# Patient Record
Sex: Male | Born: 1935 | Race: White | Hispanic: No | Marital: Married | Smoking: Never smoker
Health system: Southern US, Community
[De-identification: ages and names within clinical notes are randomized; demographics above are authoritative.]

## PROBLEM LIST (undated history)

## (undated) DIAGNOSIS — R159 Full incontinence of feces: Secondary | ICD-10-CM

## (undated) DIAGNOSIS — R413 Other amnesia: Secondary | ICD-10-CM

## (undated) DIAGNOSIS — G20 Parkinson's disease: Secondary | ICD-10-CM

## (undated) DIAGNOSIS — I82409 Acute embolism and thrombosis of unspecified deep veins of unspecified lower extremity: Secondary | ICD-10-CM

## (undated) DIAGNOSIS — H532 Diplopia: Secondary | ICD-10-CM

## (undated) DIAGNOSIS — G4752 REM sleep behavior disorder: Secondary | ICD-10-CM

## (undated) DIAGNOSIS — R32 Unspecified urinary incontinence: Secondary | ICD-10-CM

## (undated) DIAGNOSIS — E785 Hyperlipidemia, unspecified: Secondary | ICD-10-CM

## (undated) DIAGNOSIS — G20A1 Parkinson's disease without dyskinesia, without mention of fluctuations: Secondary | ICD-10-CM

## (undated) DIAGNOSIS — R269 Unspecified abnormalities of gait and mobility: Secondary | ICD-10-CM

## (undated) HISTORY — DX: Acute embolism and thrombosis of unspecified deep veins of unspecified lower extremity: I82.409

## (undated) HISTORY — DX: Full incontinence of feces: R15.9

## (undated) HISTORY — DX: Parkinson's disease without dyskinesia, without mention of fluctuations: G20.A1

## (undated) HISTORY — DX: Hyperlipidemia, unspecified: E78.5

## (undated) HISTORY — DX: Unspecified abnormalities of gait and mobility: R26.9

## (undated) HISTORY — DX: Diplopia: H53.2

## (undated) HISTORY — PX: CHOLECYSTECTOMY: SHX55

## (undated) HISTORY — DX: Other amnesia: R41.3

## (undated) HISTORY — DX: REM sleep behavior disorder: G47.52

## (undated) HISTORY — DX: Parkinson's disease: G20

## (undated) HISTORY — DX: Unspecified urinary incontinence: R32

---

## 1999-09-01 ENCOUNTER — Encounter: Payer: Self-pay | Admitting: General Surgery

## 1999-09-03 ENCOUNTER — Observation Stay (HOSPITAL_COMMUNITY): Admission: RE | Admit: 1999-09-03 | Discharge: 1999-09-04 | Payer: Self-pay | Admitting: General Surgery

## 2000-09-15 ENCOUNTER — Encounter: Admission: RE | Admit: 2000-09-15 | Discharge: 2000-09-15 | Payer: Self-pay | Admitting: Internal Medicine

## 2000-09-15 ENCOUNTER — Encounter: Payer: Self-pay | Admitting: Internal Medicine

## 2000-09-16 ENCOUNTER — Encounter: Admission: RE | Admit: 2000-09-16 | Discharge: 2000-09-16 | Payer: Self-pay | Admitting: Internal Medicine

## 2000-09-16 ENCOUNTER — Encounter: Payer: Self-pay | Admitting: Internal Medicine

## 2000-09-30 ENCOUNTER — Encounter: Payer: Self-pay | Admitting: Internal Medicine

## 2000-09-30 ENCOUNTER — Encounter: Admission: RE | Admit: 2000-09-30 | Discharge: 2000-09-30 | Payer: Self-pay | Admitting: Internal Medicine

## 2010-05-06 ENCOUNTER — Encounter: Admission: RE | Admit: 2010-05-06 | Discharge: 2010-08-04 | Payer: Self-pay | Admitting: Neurology

## 2011-06-24 ENCOUNTER — Inpatient Hospital Stay (HOSPITAL_COMMUNITY)
Admission: EM | Admit: 2011-06-24 | Discharge: 2011-06-30 | DRG: 682 | Disposition: A | Discharge status: Rehabilitation Facility | Payer: Medicare Other | Attending: Internal Medicine | Admitting: Internal Medicine

## 2011-06-24 ENCOUNTER — Emergency Department (HOSPITAL_COMMUNITY): Payer: Medicare Other

## 2011-06-24 DIAGNOSIS — K117 Disturbances of salivary secretion: Secondary | ICD-10-CM | POA: Diagnosis present

## 2011-06-24 DIAGNOSIS — Z66 Do not resuscitate: Secondary | ICD-10-CM | POA: Diagnosis present

## 2011-06-24 DIAGNOSIS — E870 Hyperosmolality and hypernatremia: Secondary | ICD-10-CM | POA: Diagnosis present

## 2011-06-24 DIAGNOSIS — J3489 Other specified disorders of nose and nasal sinuses: Secondary | ICD-10-CM | POA: Diagnosis present

## 2011-06-24 DIAGNOSIS — I10 Essential (primary) hypertension: Secondary | ICD-10-CM | POA: Diagnosis present

## 2011-06-24 DIAGNOSIS — K5900 Constipation, unspecified: Secondary | ICD-10-CM | POA: Diagnosis present

## 2011-06-24 DIAGNOSIS — J189 Pneumonia, unspecified organism: Secondary | ICD-10-CM | POA: Diagnosis not present

## 2011-06-24 DIAGNOSIS — F0280 Dementia in other diseases classified elsewhere without behavioral disturbance: Secondary | ICD-10-CM | POA: Diagnosis present

## 2011-06-24 DIAGNOSIS — N179 Acute kidney failure, unspecified: Principal | ICD-10-CM | POA: Diagnosis present

## 2011-06-24 DIAGNOSIS — E785 Hyperlipidemia, unspecified: Secondary | ICD-10-CM | POA: Diagnosis present

## 2011-06-24 DIAGNOSIS — Z79899 Other long term (current) drug therapy: Secondary | ICD-10-CM

## 2011-06-24 DIAGNOSIS — M6282 Rhabdomyolysis: Secondary | ICD-10-CM | POA: Diagnosis present

## 2011-06-24 LAB — CBC
HCT: 38.4 % — ABNORMAL LOW (ref 39.0–52.0)
HCT: 34.6 % — ABNORMAL LOW (ref 39.0–52.0)
Hemoglobin: 13.6 g/dL (ref 13.0–17.0)
Hemoglobin: 12.2 g/dL — ABNORMAL LOW (ref 13.0–17.0)
MCH: 30.4 pg (ref 26.0–34.0)
MCHC: 35.3 g/dL (ref 30.0–36.0)
RBC: 4.42 MIL/uL (ref 4.22–5.81)
RBC: 4.01 MIL/uL — ABNORMAL LOW (ref 4.22–5.81)
WBC: 11.6 10*3/uL — ABNORMAL HIGH (ref 4.0–10.5)

## 2011-06-24 LAB — DIFFERENTIAL
Basophils Absolute: 0 10*3/uL (ref 0.0–0.1)
Lymphocytes Relative: 6 % — ABNORMAL LOW (ref 12–46)
Monocytes Absolute: 0.8 10*3/uL (ref 0.1–1.0)
Monocytes Relative: 8 % (ref 3–12)
Neutro Abs: 8.7 10*3/uL — ABNORMAL HIGH (ref 1.7–7.7)
Neutrophils Relative %: 86 % — ABNORMAL HIGH (ref 43–77)

## 2011-06-24 LAB — URINALYSIS, ROUTINE W REFLEX MICROSCOPIC
Glucose, UA: NEGATIVE mg/dL
Hgb urine dipstick: NEGATIVE
Leukocytes, UA: NEGATIVE
Specific Gravity, Urine: 1.018 (ref 1.005–1.030)
pH: 5 (ref 5.0–8.0)

## 2011-06-24 LAB — BASIC METABOLIC PANEL
BUN: 100 mg/dL — ABNORMAL HIGH (ref 6–23)
CO2: 15 mEq/L — ABNORMAL LOW (ref 19–32)
Chloride: 109 mEq/L (ref 96–112)
GFR calc Af Amer: 5 mL/min — ABNORMAL LOW (ref >60)
GFR calc non Af Amer: 4 mL/min — ABNORMAL LOW (ref >60)
Glucose, Bld: 80 mg/dL (ref 70–99)
Potassium: 4.5 mEq/L (ref 3.5–5.1)
Potassium: 4.1 mEq/L (ref 3.5–5.1)
Sodium: 145 mEq/L (ref 135–145)
Sodium: 146 mEq/L — ABNORMAL HIGH (ref 135–145)

## 2011-06-24 LAB — CK TOTAL AND CKMB (NOT AT ARMC)
CK, MB: 11.6 ng/mL (ref 0.3–4.0)
Total CK: 851 U/L — ABNORMAL HIGH (ref 7–232)

## 2011-06-25 ENCOUNTER — Inpatient Hospital Stay (HOSPITAL_COMMUNITY): Payer: Medicare Other

## 2011-06-25 ENCOUNTER — Other Ambulatory Visit (HOSPITAL_COMMUNITY): Payer: PRIVATE HEALTH INSURANCE

## 2011-06-25 LAB — SODIUM, URINE, RANDOM: Sodium, Ur: 62 mEq/L

## 2011-06-25 LAB — SEDIMENTATION RATE: Sed Rate: 44 mm/hr — ABNORMAL HIGH (ref 0–16)

## 2011-06-25 LAB — CBC
MCH: 31.5 pg (ref 26.0–34.0)
MCHC: 35.8 g/dL (ref 30.0–36.0)
Platelets: 198 10*3/uL (ref 150–400)
RDW: 14.7 % (ref 11.5–15.5)

## 2011-06-25 LAB — CARDIAC PANEL(CRET KIN+CKTOT+MB+TROPI)
CK, MB: 13 ng/mL (ref 0.3–4.0)
CK, MB: 9.8 ng/mL (ref 0.3–4.0)
Relative Index: 1.5 (ref 0.0–2.5)
Total CK: 748 U/L — ABNORMAL HIGH (ref 7–232)
Troponin I: <0.3 ng/mL (ref <0.30)
Troponin I: <0.3 ng/mL (ref <0.30)
Troponin I: <0.3 ng/mL (ref <0.30)

## 2011-06-25 LAB — DIFFERENTIAL
Basophils Absolute: 0 10*3/uL (ref 0.0–0.1)
Basophils Relative: 0 % (ref 0–1)
Eosinophils Absolute: 0.1 10*3/uL (ref 0.0–0.7)
Eosinophils Relative: 1 % (ref 0–5)
Monocytes Absolute: 0.9 10*3/uL (ref 0.1–1.0)
Monocytes Relative: 9 % (ref 3–12)
Neutro Abs: 8.5 10*3/uL — ABNORMAL HIGH (ref 1.7–7.7)

## 2011-06-25 LAB — LIPID PANEL
HDL: 44 mg/dL (ref >39)
LDL Cholesterol: 116 mg/dL — ABNORMAL HIGH (ref 0–99)
Total CHOL/HDL Ratio: 4.1 RATIO
Triglycerides: 107 mg/dL (ref <150)

## 2011-06-25 LAB — PROTEIN / CREATININE RATIO, URINE
Creatinine, Urine: 127.97 mg/dL
Total Protein, Urine: 12.8 mg/dL

## 2011-06-25 LAB — VITAMIN D 25 HYDROXY (VIT D DEFICIENCY, FRACTURES): Vit D, 25-Hydroxy: 52 ng/mL (ref 30–89)

## 2011-06-25 LAB — BASIC METABOLIC PANEL
Calcium: 8.4 mg/dL (ref 8.4–10.5)
GFR calc Af Amer: 9 mL/min — ABNORMAL LOW (ref >60)
GFR calc non Af Amer: 7 mL/min — ABNORMAL LOW (ref >60)
Glucose, Bld: 87 mg/dL (ref 70–99)
Potassium: 5 mEq/L (ref 3.5–5.1)
Sodium: 150 mEq/L — ABNORMAL HIGH (ref 135–145)

## 2011-06-25 LAB — GLUCOSE, CAPILLARY: Glucose-Capillary: 70 mg/dL (ref 70–99)

## 2011-06-25 LAB — PHOSPHORUS: Phosphorus: 4.3 mg/dL (ref 2.3–4.6)

## 2011-06-26 ENCOUNTER — Inpatient Hospital Stay (HOSPITAL_COMMUNITY): Payer: Medicare Other

## 2011-06-26 LAB — COMPREHENSIVE METABOLIC PANEL
ALT: 57 U/L — ABNORMAL HIGH (ref 0–53)
AST: 38 U/L — ABNORMAL HIGH (ref 0–37)
Alkaline Phosphatase: 115 U/L (ref 39–117)
CO2: 29 mEq/L (ref 19–32)
Calcium: 8.3 mg/dL — ABNORMAL LOW (ref 8.4–10.5)
Chloride: 115 mEq/L — ABNORMAL HIGH (ref 96–112)
GFR calc Af Amer: >60 mL/min (ref >60)
GFR calc non Af Amer: 50 mL/min — ABNORMAL LOW (ref >60)
Glucose, Bld: 121 mg/dL — ABNORMAL HIGH (ref 70–99)
Potassium: 3.4 mEq/L — ABNORMAL LOW (ref 3.5–5.1)
Sodium: 156 mEq/L — ABNORMAL HIGH (ref 135–145)

## 2011-06-26 LAB — GLUCOSE, CAPILLARY
Glucose-Capillary: 88 mg/dL (ref 70–99)
Glucose-Capillary: 89 mg/dL (ref 70–99)
Glucose-Capillary: 99 mg/dL (ref 70–99)

## 2011-06-26 LAB — CBC
HCT: 37 % — ABNORMAL LOW (ref 39.0–52.0)
Hemoglobin: 12.6 g/dL — ABNORMAL LOW (ref 13.0–17.0)
MCH: 30.4 pg (ref 26.0–34.0)
MCHC: 34.1 g/dL (ref 30.0–36.0)
MCV: 89.4 fL (ref 78.0–100.0)
RBC: 4.14 MIL/uL — ABNORMAL LOW (ref 4.22–5.81)

## 2011-06-27 LAB — HEPATIC FUNCTION PANEL
Alkaline Phosphatase: 109 U/L (ref 39–117)
Bilirubin, Direct: 0.1 mg/dL (ref 0.0–0.3)
Indirect Bilirubin: 0.5 mg/dL (ref 0.3–0.9)
Total Bilirubin: 0.6 mg/dL (ref 0.3–1.2)

## 2011-06-27 LAB — BASIC METABOLIC PANEL
Calcium: 8.2 mg/dL — ABNORMAL LOW (ref 8.4–10.5)
GFR calc Af Amer: >60 mL/min (ref >60)
GFR calc non Af Amer: >60 mL/min (ref >60)
Potassium: 3.6 mEq/L (ref 3.5–5.1)
Sodium: 157 mEq/L — ABNORMAL HIGH (ref 135–145)

## 2011-06-27 LAB — CK TOTAL AND CKMB (NOT AT ARMC)
CK, MB: 5.8 ng/mL — ABNORMAL HIGH (ref 0.3–4.0)
Total CK: 672 U/L — ABNORMAL HIGH (ref 7–232)

## 2011-06-27 LAB — CBC
Hemoglobin: 12.8 g/dL — ABNORMAL LOW (ref 13.0–17.0)
MCHC: 33 g/dL (ref 30.0–36.0)
RDW: 15 % (ref 11.5–15.5)
WBC: 9.6 10*3/uL (ref 4.0–10.5)

## 2011-06-27 LAB — VITAMIN D 1,25 DIHYDROXY
Vitamin D 1, 25 (OH)2 Total: 30 pg/mL (ref 18–72)
Vitamin D3 1, 25 (OH)2: <8 pg/mL

## 2011-06-27 LAB — GLUCOSE, CAPILLARY: Glucose-Capillary: 135 mg/dL — ABNORMAL HIGH (ref 70–99)

## 2011-06-28 ENCOUNTER — Inpatient Hospital Stay (HOSPITAL_COMMUNITY): Payer: Medicare Other

## 2011-06-28 LAB — CBC
HCT: 41.1 % (ref 39.0–52.0)
MCH: 30.6 pg (ref 26.0–34.0)
MCHC: 33.6 g/dL (ref 30.0–36.0)
MCV: 91.1 fL (ref 78.0–100.0)
RDW: 14.9 % (ref 11.5–15.5)
WBC: 11.3 10*3/uL — ABNORMAL HIGH (ref 4.0–10.5)

## 2011-06-28 LAB — BASIC METABOLIC PANEL
BUN: 19 mg/dL (ref 6–23)
Creatinine, Ser: 1.13 mg/dL (ref 0.50–1.35)
GFR calc non Af Amer: >60 mL/min (ref >60)
Glucose, Bld: 136 mg/dL — ABNORMAL HIGH (ref 70–99)
Potassium: 3.5 mEq/L (ref 3.5–5.1)

## 2011-06-29 DIAGNOSIS — R5381 Other malaise: Secondary | ICD-10-CM

## 2011-06-29 DIAGNOSIS — G20 Parkinson's disease: Secondary | ICD-10-CM

## 2011-06-29 LAB — BASIC METABOLIC PANEL
BUN: 17 mg/dL (ref 6–23)
Creatinine, Ser: 1.1 mg/dL (ref 0.50–1.35)
GFR calc Af Amer: >60 mL/min (ref >60)
GFR calc non Af Amer: >60 mL/min (ref >60)

## 2011-06-29 LAB — CBC
Hemoglobin: 13.4 g/dL (ref 13.0–17.0)
MCH: 30.2 pg (ref 26.0–34.0)
MCHC: 33.2 g/dL (ref 30.0–36.0)

## 2011-06-30 ENCOUNTER — Inpatient Hospital Stay (HOSPITAL_COMMUNITY): Payer: Medicare Other

## 2011-06-30 ENCOUNTER — Inpatient Hospital Stay (HOSPITAL_COMMUNITY)
Admission: RE | Admit: 2011-06-30 | Discharge: 2011-07-14 | DRG: 945 | Disposition: A | Discharge status: Skilled Nursing Facility | Payer: Medicare Other | Source: Other Acute Inpatient Hospital | Attending: Physical Medicine & Rehabilitation | Admitting: Physical Medicine & Rehabilitation

## 2011-06-30 DIAGNOSIS — D72829 Elevated white blood cell count, unspecified: Secondary | ICD-10-CM

## 2011-06-30 DIAGNOSIS — R5381 Other malaise: Secondary | ICD-10-CM

## 2011-06-30 DIAGNOSIS — F3289 Other specified depressive episodes: Secondary | ICD-10-CM

## 2011-06-30 DIAGNOSIS — R1310 Dysphagia, unspecified: Secondary | ICD-10-CM

## 2011-06-30 DIAGNOSIS — R269 Unspecified abnormalities of gait and mobility: Secondary | ICD-10-CM

## 2011-06-30 DIAGNOSIS — G20A1 Parkinson's disease without dyskinesia, without mention of fluctuations: Secondary | ICD-10-CM

## 2011-06-30 DIAGNOSIS — Z5189 Encounter for other specified aftercare: Principal | ICD-10-CM

## 2011-06-30 DIAGNOSIS — M6282 Rhabdomyolysis: Secondary | ICD-10-CM

## 2011-06-30 DIAGNOSIS — N179 Acute kidney failure, unspecified: Secondary | ICD-10-CM

## 2011-06-30 DIAGNOSIS — K5909 Other constipation: Secondary | ICD-10-CM

## 2011-06-30 DIAGNOSIS — R339 Retention of urine, unspecified: Secondary | ICD-10-CM

## 2011-06-30 DIAGNOSIS — F329 Major depressive disorder, single episode, unspecified: Secondary | ICD-10-CM

## 2011-06-30 DIAGNOSIS — N138 Other obstructive and reflux uropathy: Secondary | ICD-10-CM

## 2011-06-30 DIAGNOSIS — J189 Pneumonia, unspecified organism: Secondary | ICD-10-CM

## 2011-06-30 DIAGNOSIS — F0390 Unspecified dementia without behavioral disturbance: Secondary | ICD-10-CM

## 2011-06-30 DIAGNOSIS — Z79899 Other long term (current) drug therapy: Secondary | ICD-10-CM

## 2011-06-30 DIAGNOSIS — E785 Hyperlipidemia, unspecified: Secondary | ICD-10-CM

## 2011-06-30 DIAGNOSIS — G20 Parkinson's disease: Secondary | ICD-10-CM

## 2011-06-30 DIAGNOSIS — N401 Enlarged prostate with lower urinary tract symptoms: Secondary | ICD-10-CM

## 2011-06-30 DIAGNOSIS — E870 Hyperosmolality and hypernatremia: Secondary | ICD-10-CM

## 2011-06-30 DIAGNOSIS — I951 Orthostatic hypotension: Secondary | ICD-10-CM

## 2011-06-30 NOTE — Op Note (Signed)
  NAMEROLLY, MAGRI NO.:  [id number]  MEDICAL RECORD NO.:  [id number]  LOCATION:  4710                         FACILITY:  [facility]  PHYSICIAN:  Antony Contras, MD     DATE OF BIRTH:  16-May-1935  DATE OF PROCEDURE:  06/26/2011 DATE OF DISCHARGE:                              OPERATIVE REPORT   PREOPERATIVE DIAGNOSIS:  Nasal mass.  POSTOPERATIVE DIAGNOSIS:  Caked and dried secretions in the pharynx and oral cavity.  PROCEDURE:  Flexible laryngoscopy.  SURGEON:  Antony Contras, MD  ANESTHESIA:  None.  COMPLICATIONS:  None.  INDICATIONS:  The patient is a 76 year old white male who is admitted to the hospital due to acute renal failure and while trying to place a nasogastric tube, nursing had obstruction and then dislodged a mass-like structure.  Evaluation was requested.  FINDINGS:  After passing a little bit of crusting at the anterior nose, the nasal passages are normal on both sides with normal turbinates andnormal septum and no mass or abnormality.  The nasopharynx is also normal.  The pharynx is lined with dry secretions much like the mouth with a prominent globe in the vallecula.  There is no other mass or abnormality seen.  DESCRIPTION OF PROCEDURE:  The patient was identified in the hospital room and informed consent was obtained from the family after discussion of risks, benefits, and alternatives.  A flexible laryngoscope was passed first through the right nasal passage and into the left nasal passage, to the nasal passages, pharynx and larynx.  Findings as noted above.  After this, the telescope was removed.  RECOMMENDATIONS:  The mass-like structure removed by nursing was simply crusted secretions.  He has a significant build-up in his mouth and pharynx largely due to dryness and poor swallow.  I assisted the nursing staff with successful placement of a nasogastric tube.  I discussed the case with the primary team who approved allowing him  to sip water only to try to moisturize his secretions so he can handle them better.  I will order a humidified face shield as well and ask that the family continue to swab his mouth regularly with moisturization.     Antony Contras, MD     DDB/MEDQ  D:  06/26/2011  T:  06/26/2011  Job:  [id number]  Electronically Signed by Christia Reading MD on 06/30/2011 08:13:21 PM

## 2011-06-30 NOTE — Consult Note (Signed)
Darin Hickman, DELPILAR NO.:  [id number]  MEDICAL RECORD NO.:  [id number]  LOCATION:  4710                         FACILITY:  [facility]  PHYSICIAN:  Antony Contras, MD     DATE OF BIRTH:  May 14, 1935  DATE OF CONSULTATION:  06/26/2011 DATE OF DISCHARGE:                                CONSULTATION   REQUESTING SERVICE:  [facility].  CHIEF COMPLAINT:  Nasal mass.  HISTORY OF PRESENT ILLNESS:  The patient is a 76 year old white male who was admitted to the hospital 2 days ago due to mental status changes and convulsions and was found to be in acute renal failure.  He has a background of Parkinson disease and his family noticed increased tremors, and jerking movements that made him think seizure activity. His wife had noted that he was not voiding very well either and he has been constipated for the past 4-5 days.  P.o. intake has been significantly impaired as well.  He was admitted to the hospital with acute renal failure and treated with hydration and his renal failure has responded quite rapidly.  However, his mental status remains decreased. Consultation was requested of ENT because the nursing staff tried to place a nasogastric tube today and met obstruction.  Upon removing the tube, a mass-like structure came out of the nose.  The nasogastric tube is being placed for nutritional purposes due to his poor swallow and poor mental status.  The patient is not able to give any history because of his mental status.  PAST MEDICAL HISTORY: 1. Parkinson disease. 2. Dementia. 3. Acute renal failure.  HOME MEDICATIONS:  Sinemet, Comtan, amantadine, mirtazapine, calcium and vitamin D.  ALLERGIES:  None.  SOCIAL HISTORY:  The family denies that he smokes or use alcohol.  He lives at home with his wife, but there are plans for transfer to a skilled nursing facility.  REVIEW OF SYSTEMS:  Unable to be obtained.  PHYSICAL EXAMINATION:  VITAL SIGNS:  T-max  100.3.  Vital signs stable. GENERAL:  The patient is somnolent and not interactive, but does respond to stimulus. HEENT:  Eyes, the eyes are bit sunken, but pupils are equal and reactive to light.  Ears, external ears normal.  External canals are patent. Nose, external nose is normal.  Nasal passages are somewhat obstructed anteriorly due to crusting. Oral cavity, oropharynx, the lips are normal.  The patient is edentulous.  Mucous membranes are dry and there is buildup of crusting along the superior alveolus medially as well as on the soft palate area. The remainder of the mouth is otherwise normal. NECK:  Nontender without mass or deformity. SALIVARY GLANDS:  Normal to palpation. THYROID:  Normal to palpation. LYMPHATICS:  There are no enlarged lymph nodes in the neck. NEUROLOGIC: Cranial nerves II-XII are difficult to examine due to mental status. HEARING:  Difficult to determine. VOICE:  The patient did not voice during the visit.  ASSESSMENT:  The patient is a 76 year old white male with an unknown mass-like structure that came out during placement of nasogastric tube.  PLAN:  The nasal passages and pharynx will be examined via a flexible laryngoscopy.  The thickened  crusted secretions in the mouth are suggestive of what came out with the nasogastric tube.  This will be confirmed at laryngoscopy.     Antony Contras, MD     DDB/MEDQ  D:  06/26/2011  T:  06/26/2011  Job:  [id number]  Electronically Signed by Christia Reading MD on 06/30/2011 08:13:28 PM

## 2011-07-01 ENCOUNTER — Inpatient Hospital Stay (HOSPITAL_COMMUNITY): Payer: Medicare Other

## 2011-07-01 DIAGNOSIS — R5381 Other malaise: Secondary | ICD-10-CM

## 2011-07-01 DIAGNOSIS — R471 Dysarthria and anarthria: Secondary | ICD-10-CM

## 2011-07-01 DIAGNOSIS — G20 Parkinson's disease: Secondary | ICD-10-CM

## 2011-07-01 LAB — CBC
HCT: 40 % (ref 39.0–52.0)
Hemoglobin: 13.4 g/dL (ref 13.0–17.0)
MCH: 30.4 pg (ref 26.0–34.0)
MCHC: 33.5 g/dL (ref 30.0–36.0)

## 2011-07-01 LAB — DIFFERENTIAL
Basophils Absolute: 0.1 10*3/uL (ref 0.0–0.1)
Lymphocytes Relative: 13 % (ref 12–46)
Monocytes Absolute: 0.7 10*3/uL (ref 0.1–1.0)
Monocytes Relative: 7 % (ref 3–12)
Neutro Abs: 8.3 10*3/uL — ABNORMAL HIGH (ref 1.7–7.7)

## 2011-07-01 LAB — URINALYSIS, ROUTINE W REFLEX MICROSCOPIC
Bilirubin Urine: NEGATIVE
Glucose, UA: NEGATIVE mg/dL
Hgb urine dipstick: NEGATIVE
Specific Gravity, Urine: 1.021 (ref 1.005–1.030)
pH: 7.5 (ref 5.0–8.0)

## 2011-07-01 LAB — COMPREHENSIVE METABOLIC PANEL
Alkaline Phosphatase: 108 U/L (ref 39–117)
BUN: 21 mg/dL (ref 6–23)
GFR calc Af Amer: >60 mL/min (ref >60)
GFR calc non Af Amer: 54 mL/min — ABNORMAL LOW (ref >60)
Glucose, Bld: 94 mg/dL (ref 70–99)
Potassium: 4 mEq/L (ref 3.5–5.1)
Total Bilirubin: 0.6 mg/dL (ref 0.3–1.2)
Total Protein: 5.9 g/dL — ABNORMAL LOW (ref 6.0–8.3)

## 2011-07-01 LAB — CULTURE, BLOOD (ROUTINE X 2)
Culture  Setup Time: 201207200941
Culture: NO GROWTH

## 2011-07-02 LAB — CK: Total CK: 58 U/L (ref 7–232)

## 2011-07-02 LAB — URINE CULTURE: Culture  Setup Time: 201207260855

## 2011-07-03 LAB — CBC
HCT: 39.3 % (ref 39.0–52.0)
Hemoglobin: 13.1 g/dL (ref 13.0–17.0)
MCV: 91.2 fL (ref 78.0–100.0)
RBC: 4.31 MIL/uL (ref 4.22–5.81)
WBC: 13 10*3/uL — ABNORMAL HIGH (ref 4.0–10.5)

## 2011-07-03 LAB — BASIC METABOLIC PANEL
BUN: 23 mg/dL (ref 6–23)
CO2: 28 mEq/L (ref 19–32)
Chloride: 104 mEq/L (ref 96–112)
Creatinine, Ser: 1.09 mg/dL (ref 0.50–1.35)
Glucose, Bld: 105 mg/dL — ABNORMAL HIGH (ref 70–99)

## 2011-07-05 DIAGNOSIS — R471 Dysarthria and anarthria: Secondary | ICD-10-CM

## 2011-07-05 DIAGNOSIS — G20 Parkinson's disease: Secondary | ICD-10-CM

## 2011-07-05 LAB — URINALYSIS, ROUTINE W REFLEX MICROSCOPIC
Bilirubin Urine: NEGATIVE
Glucose, UA: NEGATIVE mg/dL
Ketones, ur: NEGATIVE mg/dL
Specific Gravity, Urine: 1.02 (ref 1.005–1.030)
pH: 6.5 (ref 5.0–8.0)

## 2011-07-05 LAB — URINE MICROSCOPIC-ADD ON

## 2011-07-06 DIAGNOSIS — G20 Parkinson's disease: Secondary | ICD-10-CM

## 2011-07-06 DIAGNOSIS — R471 Dysarthria and anarthria: Secondary | ICD-10-CM

## 2011-07-06 LAB — URINE CULTURE
Colony Count: NO GROWTH
Culture  Setup Time: 201207301926

## 2011-07-07 ENCOUNTER — Ambulatory Visit (HOSPITAL_COMMUNITY): Payer: PRIVATE HEALTH INSURANCE

## 2011-07-07 NOTE — Discharge Summary (Signed)
Darin Hickman, Darin Hickman NO.:  [id number]  MEDICAL RECORD NO.:  [id number]  LOCATION:  4710                         FACILITY:  [facility]  PHYSICIAN:  Calvert Cantor, M.D.     DATE OF BIRTH:  Jul 16, 1935  DATE OF ADMISSION:  06/24/2011 DATE OF DISCHARGE:  06/30/2011                              DISCHARGE SUMMARY   PRIMARY CARE PHYSICIAN:  Dr. Clarene Duke in [place].  The patient does not know the first name.  PRESENTING COMPLAINT:  Brought in by wife for jerky movement, lethargy, poor p.o. intake.  DISCHARGE DIAGNOSES: 1. Acute renal failure likely from dehydration. 2. Acidosis secondary to above. 3. Hypernatremia secondary to dehydration. 4. Pneumonia, possibly aspiration. 5. Rhabdomyolysis. 6. Parkinson disease. 7. Mild dementia. 8. Do not resuscitate.  DISCHARGE MEDICATIONS: 1. Augmentin 875 mg b.i.d. for three more days, he will start his     first dose tonight. 2. Ensure clinical strength 237 mL twice a day. 3. Amantadine 100 mg twice a day. 4. Comtan 200 mg four times a day. 5. Mirtazapine 15 mg daily at bedtime. 6. Sinemet 25/250 one tablet four times a day.  CONSULTS: 1. Nephrology consult with Dr. Zetta Bills. 2. ENT consult with Dr. Christia Reading.  PROCEDURES: 1. Chest x-ray, portable, June 24, 2011, did not reveal any acute     cardiopulmonary disease.  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 2. CT of the head without contrast, June 24, 2011, revealed a small     vessel disease type changes.  No intracranial hemorrhage or CT     evidence of acute infarct.  There was mild global atrophy and     vascular calcifications. 3. Ultrasound of the kidneys, June 25, 2011 revealed normal bilateral     renal ultrasound. 4. Chest x-ray, portable, June 26, 2011 revealed new patchy     bronchopneumonia involving the medial right lung base.  Nasogastric     tube was noted in the fundus of the stomach. 5. Swallow eval, June 28, 2011, did not reveal any aspiration, but     found him to be at a high risk for aspiration.  He was placed on     pureed diet with thin liquids. 6. Flexible laryngoscopy performed June 26, 2011 by Dr. Christia Reading     revealed crusting in the anterior part of nose, normal nasal     passages, turbinate, septum, and nasopharynx.  Pharynx was lined     with dried secretions much like the mouth with a prominent globe in     the vallecula.  No other mass or abnormality noted.  HOSPITAL COURSE:  This is a 76 year old male with Parkinson disease, who was brought in by his wife for poor p.o. intake, lethargy, increasing jerky movements, and decreased urine output.  She also noticed he had been constipated.  The patient was found to be in acute renal failure.  BUN was 104, creatinine 12.8.  Foley catheter was placed and 2 liters of urine was obtained and there was a question of whether or not he may have had urinary retention.  His sodium was noted  to be 146.  Bicarb was 15.  The patient was admitted to the Hospitalist Service and started on IV fluids.  He was seen by Dr. Zetta Bills in consult.  Interestingly following day, creatinine improved significantly to 7.24 and then 1.39 on the next day.  It has been within normal limits since then.  The patient was quite lethargic.  NG tube was placed through which he received feeding.  Nurse noted a large amount of crusting in his mouth and the mass was retrieved from his oropharynx.  An ENT consult was requested and a flexible laryngeal revealed further crusting essentially suspected to be due to poor swallowing ability.  Eventually, the patient became more alert, but speech and swallow eval was performed and no visible aspiration was noted.  The patient was placed on dysphagia diet with thin liquids, but also on strict aspiration precautions.  He has been swallowing well and he is now eating quite well.  On July 21, he was noted to have  low-grade fevers.  A chest x-ray was obtained, which revealed above-mentioned infiltrate.  It was suspected he may have aspirated and he was started on Zosyn, today he is day 5 of Zosyn.  He is not having any cough.  His oxygen levels on room air are satisfactory and his lungs are clear.  He will take three more days of Augmentin to complete a 7-day course.  The patient did have mild rhabdomyolysis.  CK on admission was 851, increasing to 921 the following day, and has since then been improving.  Sodium on arrival was 146 and worsened to 157 on July 22; with D5 water, it has improved to 145 when last checked yesterday.  PERTINENT LAB WORK:  Blood cultures have remained negative x2 sets. Lipid profile revealed a slightly elevated LDL at 116.  TSH was within normal limits at 2.313.  Vitamin D levels were within normal limits. ESR was mildly elevated at 44 when checked on July 20.  PHYSICAL EXAM:  GENERAL:  Today, the patient is awake, alert, oriented x3. HEENT:  Oral mucosa is moist. NECK:  Supple.  No thyromegaly or lymphadenopathy. LUNGS:  Clear bilaterally with good respiratory effort. HEART:  Regular rate and rhythm.  No murmurs. ABDOMEN:  Soft, nontender, nondistended.  Bowel sounds positive. EXTREMITIES:  No cyanosis, clubbing, or edema.  The patient has been evaluated by rehab and was recommended to go to rehab facility.  We did have CIR evaluate him as recommended by PT and they have expected him for rehab.  He is being transferred there today. The patient will also receive OT as well.  FOLLOWUP INSTRUCTIONS:  He can follow up with his primary care physician in 1-2 weeks.  Time on patient care today was 50 minutes.     Calvert Cantor, M.D.     SR/MEDQ  D:  06/30/2011  T:  06/30/2011  Job:  [id number]  Electronically Signed by Calvert Cantor M.D. on 07/07/2011 07:26:49 AM

## 2011-07-08 DIAGNOSIS — G20 Parkinson's disease: Secondary | ICD-10-CM

## 2011-07-08 DIAGNOSIS — R471 Dysarthria and anarthria: Secondary | ICD-10-CM

## 2011-07-08 LAB — CBC
HCT: 40.8 % (ref 39.0–52.0)
MCH: 31.1 pg (ref 26.0–34.0)
MCV: 90.1 fL (ref 78.0–100.0)
Platelets: 334 10*3/uL (ref 150–400)
RBC: 4.53 MIL/uL (ref 4.22–5.81)
RDW: 14.4 % (ref 11.5–15.5)

## 2011-07-09 DIAGNOSIS — G20 Parkinson's disease: Secondary | ICD-10-CM

## 2011-07-09 DIAGNOSIS — R471 Dysarthria and anarthria: Secondary | ICD-10-CM

## 2011-07-09 DIAGNOSIS — R1310 Dysphagia, unspecified: Secondary | ICD-10-CM

## 2011-07-12 ENCOUNTER — Inpatient Hospital Stay (HOSPITAL_COMMUNITY): Payer: Medicare Other

## 2011-07-12 DIAGNOSIS — G20 Parkinson's disease: Secondary | ICD-10-CM

## 2011-07-12 DIAGNOSIS — R471 Dysarthria and anarthria: Secondary | ICD-10-CM

## 2011-07-12 LAB — CBC
HCT: 41 % (ref 39.0–52.0)
Hemoglobin: 13.8 g/dL (ref 13.0–17.0)
MCH: 30.4 pg (ref 26.0–34.0)
MCHC: 33.7 g/dL (ref 30.0–36.0)
RDW: 14.2 % (ref 11.5–15.5)

## 2011-07-12 LAB — BASIC METABOLIC PANEL
BUN: 17 mg/dL (ref 6–23)
Calcium: 9.1 mg/dL (ref 8.4–10.5)
GFR calc Af Amer: >60 mL/min (ref >60)
GFR calc non Af Amer: >60 mL/min (ref >60)
Glucose, Bld: 96 mg/dL (ref 70–99)
Potassium: 3.8 mEq/L (ref 3.5–5.1)

## 2011-07-13 LAB — URINALYSIS, ROUTINE W REFLEX MICROSCOPIC
Bilirubin Urine: NEGATIVE
Ketones, ur: NEGATIVE mg/dL
Nitrite: POSITIVE — AB
Protein, ur: NEGATIVE mg/dL
Specific Gravity, Urine: 1.008 (ref 1.005–1.030)
Urobilinogen, UA: 1 mg/dL (ref 0.0–1.0)

## 2011-07-13 LAB — URINE MICROSCOPIC-ADD ON

## 2011-07-14 DIAGNOSIS — R471 Dysarthria and anarthria: Secondary | ICD-10-CM

## 2011-07-14 DIAGNOSIS — R1310 Dysphagia, unspecified: Secondary | ICD-10-CM

## 2011-07-14 DIAGNOSIS — G20 Parkinson's disease: Secondary | ICD-10-CM

## 2011-07-15 LAB — URINE CULTURE
Colony Count: >=100000
Culture  Setup Time: 201208071406

## 2011-07-20 NOTE — H&P (Signed)
Darin Hickman, Darin Hickman NO.:  [id number]  MEDICAL RECORD NO.:  [id number]  LOCATION:  4148                         FACILITY:  [facility]  PHYSICIAN:  Ranelle Oyster, M.D.DATE OF BIRTH:  22-Sep-1935  DATE OF ADMISSION:  06/30/2011 DATE OF DISCHARGE:                             HISTORY & PHYSICAL   CHIEF COMPLAINT:  Weakness and loss of balance as well as confusion.  PRIMARY CARE PHYSICIAN:  Dr. Mallie Snooks.  HISTORY OF PRESENT ILLNESS:  This is a 76 year old white male with Parkinson's disease and chronic constipation, admitted on June 24, 2011 with voiding problems.  He developed seizure-type activity and tremors and sedation at the time of admission.  Workup revealed rhabdomyolysis with CK of 851 and hypernatremia.  He was seen by Renal who recommend IV fluids for hydration and Foley for strict I's and O's.  Renal ultrasound was normal.  The patient continue with lethargy and low-grade fever and chest x-ray on June 26, 2011 showed patchy bronchopneumonia in the medial right lung base and he was started on IV antibiotics for treatment.  Modified barium swallow was done on June 28, 2011 showing severe residue, no penetration or aspiration.  He was placed on D1 thin liquid diet.  Therapies were initiated and requires cues for sequencing, motor planning and needs max facilitation for upright posture and motor planning.  I was asked to see this patient yesterday for rehab consultation and felt he could benefit from an inpatient rehab stay.  REVIEW OF SYSTEMS:  Notable for nocturia, urinary hesitancy.  Occasional problems of swallowing solids at home.  He had occasional incontinence and weakness.  Full 12-point review is in the written H and P.  PAST MEDICAL HISTORY:  Positive for: 1. Parkinson's disease. 2. Dementia, which has been fairly mild for family. 3. Constipation. 4. Dyslipidemia. 5. Gait disorder. 6. Falls.  FAMILY HISTORY:  Positive for CVA and  hypertension.  SOCIAL HISTORY:  The patient is married, lives in one-level house with three to four steps to enter.  Does not smoke or drink.  Wife can assist at discharge.  ALLERGIES:  None.  HOME MEDICATIONS:  Remeron, Comtan, amantadine, and Sinemet.  LABORATORY DATA:  Hemoglobin 13.4, white count 10, platelets 219. Sodium 145, potassium 3.5, BUN 17, creatinine 1.10.  PHYSICAL EXAMINATION:  VITAL SIGNS:  Blood pressure is 102/70, pulse 71, respiratory rate 20, temperature 98.0. GENERAL:  The patient is generally alert and pleasant.  He has masked facies.  He is very thin. HEENT:  Pupils are equally round and reactive to light.  Ear, nose, and throat exams are notable for partial dentures and dry mucosa. NECK:  Supple without JVD or lymphadenopathy. CHEST:  Clear to auscultation bilaterally without wheezes, rales or rhonchi. HEART:  Notable for systolic murmur, otherwise regular. EXTREMITIES:  Showed no clubbing, cyanosis or edema. ABDOMEN:  Soft, nontender.  Bowel sounds are positive. SKIN:  Generally intact. NEUROLOGIC:  Cranial nerves II through XII showed no focal abnormalities.  Judgment was fair.  Orientation was intact.  Memory was fair for short-term information.  He was oriented, however, to day, date, and year with extra time.  Mood was pleasant  and slightly flat. Speech was quite nasal and dysarthric and monotone.  The patient did have some mild cogwheel rigidity and mild intentional tremor.  He had no pronator drift.  Strength is grossly 4/5 in the upper extremities proximal and distal.  Lower extremity strength is 3/5 proximally and 4/5 distally.  POST ADMISSION PHYSICIAN EVALUATION: 1. Functional deficit secondary to progressive Parkinson     disease along with rhabdomyolysis and general deconditioning. 2. The patient was admitted to receive collaborative interdisciplinary     care between the physiatrist, rehab nursing staff, and therapy     team. 3. The  patient's level of medical complexity and substantial therapy     needs in context of that medical necessity cannot be provided at a     lesser intensity of care. 4. The patient has experienced substantial functional loss from his     baseline.  Premorbidly, the patient was independent with cane until     1 week prior to arrival.  Judging by the patient's diagnosis,     physical exam, and functional history, he has potential for     functional progress, which will result in measurable gains while in     inpatient rehab.  The patient is currently min-assist bed mobility     with significant posterior lean and poor posture.  He is +2 total-     assist for stand pivot transfer, mod-assist upper body care, max-     assist lower body care.  These gains will be of substantial and     practical use upon discharge to home and facilitating mobility and     self-care. 5. The physiatrist will provide 24-hour management of the medical     needs as well as oversight of the therapy plan/treatment and     provide guidance as appropriate guarding interaction of the two.     Medical problem list and plan are below. 6. A 24-hour rehab nursing team will assist in the management of the     patient's skin care needs as well as bowel and bladder function,     nutrition, integration of therapy concepts, techniques, education,     etc. 7. PT will assess and treat for lower extremity strength, range of     motion, adaptive techniques, and equipment.  Techniques for     improving gait quality and stability, family education with goals     modified independent to supervision. 8. OT will assess and treat for upper extremity use, ADLs, adaptive     techniques, equipment, functional mobility, safety, cognitive and     perceptual training.  Goals modified independent to occasional min-     assist. 9. Speech Language Pathology will follow up for speech/communication     as well as swallowing with goals supervision to  min-assist. 10.Team conference will be held weekly to assess progress towards     goals and to determine barriers at discharge. 11.The patient has demonstrate sufficient medical stability and     exercise capacity to tolerate at least 3 hours of therapy per day     at least 5 days per week. 12.Estimated length of stay is approximately 2 weeks.  Prognosis is     good.  Family is involved.  The patient is motivated.  MEDICAL PROBLEM LIST AND PLAN: 1. Deep venous thrombosis prophylaxis, subcu Lovenox.  Platelets have     been steady.  We will follow up platelets on admission, look for     any  active signs or symptoms of bleeding. 2. Right lower lobe pneumonia:  This is day 5 of 7 of antibiotics at     this point.  Chest is clearing.  The patient is at risk for     aspiration giving his dysphagia, this will bear close observation.     Observe for signs and symptoms of pneumonia.  Speech Language     Pathology and staff will follow closely for swallowing competency. 3. Chronic constipation:  Check KUB today, rule out obstipation.     Start regular bowel program, which will need to be continued over     on an outpatient basis. 4. Acute renal failure, push p.o. fluids, remains hydration.  Continue     nutritional supplementation.  Urine is still dark-colored and the     patient still may be a bit volume depleted. 5. GU:  Discontinue Foley in the morning.  Check urinalysis and     culture on admission.  We will monitor for the patient's voiding     habits, checking PVRs and cathing as needed. 6. Parkinson's disease:  The patient will maintain on his current     medications including Comtan, amantadine, and Sinemet.     Ranelle Oyster, M.D.     ZTS/MEDQ  D:  06/30/2011  T:  06/30/2011  Job:  [id number]  cc:   Marlan Palau, M.D. Dr. Mallie Snooks  Electronically Signed by Faith Rogue M.D. on 07/20/2011 10:33:37 AM

## 2011-07-21 NOTE — Consult Note (Signed)
NAMEORVAN, PAPADAKIS NO.:  [id number]  MEDICAL RECORD NO.:  [id number]  LOCATION:  4710                         FACILITY:  [facility]  PHYSICIAN:  Zetta Bills, MD          DATE OF BIRTH:  Mar 11, 1935  DATE OF CONSULTATION:  06/24/2011 DATE OF DISCHARGE:                                CONSULTATION   Nephrology is consulted by Dr. Trudi Ida. Steinl of the emergency room for the evaluation and management of Darin Hickman acute renal insufficiency.  HISTORY OF PRESENT ILLNESS:  Darin Hickman is a 76 year old Caucasian man with a past medical history significant for Parkinson disease and apparently a healthy/normal renal function based on the recollection of his wife.  The history is furnished by his wife as Darin Hickman is currently sedated due to his intense rigors/seizure-like activity that he had while initially evaluated in the emergency room.  His wife states that he was brought to the emergency room with about a 5- day history of worsening lethargy, increased falls, and decreased oral intake, especially for food and fluids.  His only significant fluid intake is when he drank 4 glasses of Coca Cola yesterday and nothing else since then.  His wife reports that his urinary frequency appears to have been increased; however, urinary volumes have correspondingly decreased.  She denies any hematuria and states that he does not routinely have foamy urine.  She does report an increasingly dark urine since he has been started on his anti-Parkinsonian medications.  No history of diarrhea, nausea or vomiting, no history of kidney stones, no history of renal insufficiency.  No recently emerging skin rashes, complains of arthralgias, epistaxis or recurrent sinusitis-type symptoms.  His wife reports that over the last 24 hours, concern was raised as he was having more intense rigors and occasionally rolling his eyes almost as if he is having a seizure.  Last week, he was seen at a local  [facility] where he underwent disimpaction for impacted stools.  His primary care provider is Dr. Mallie Snooks in [place], [place]; however, he last saw him about 3 years ago and then 2 days ago with these complaints.  He frequently sees Dr. Lesia Sago of [facility] for his parkinsonism and apparently had normal labs done on Tuesday.  These are pending verification.  PAST MEDICAL HISTORY: 1. Parkinson disease. 2. Chronic constipation. 3. Dyslipidemia.  MEDICATIONS: 1. Comtan 200 mg 4 times per day. 2. Sinemet 25/250 mg 4 times per day. 3. Amantadine 100 mg b.i.d. 4. Mirtazapine 15 mg nightly. 5. Clonazepam 0.125 mg nightly p.r.n. 6. MiraLax 17 grams in 6 ounces water nightly p.r.n.  ALLERGIES:  No known drug allergies.  SOCIAL HISTORY:  Resides at home with [place] wife [place]  He is retired.  He has 2 sons, both of whom live locally. Comes to the emergency room accompanied by his sister as well as a cousin.  Denies any tobacco, alcohol, or illicit drug abuse.  FAMILY HISTORY:  No significant history of kidney disease in the family.  REVIEW OF SYSTEMS:  The patient is unable to provide this as he  is currently sedated.  PHYSICAL EXAMINATION:  VITAL SIGNS:  Temperature 98.6 degrees Fahrenheit, blood pressure 134/60, pulse of 102, respiratory of 30, oxygen saturation 95% on room air. GENERAL:  Slender elderly Caucasian man, resting comfortably in bed, difficult to arouse. HEAD, NECK AND ENT:  Head is normocephalic.  Oral mucosa appears dry. Lips appear chapped.  Nares appear normal and without any sequelae of epistaxis. NECK:  Supple without any obvious JVD or goiter.  No lymphadenopathy. No bruit. CARDIOVASCULAR:  Pulse is regular, tachycardia.  Heart sounds S1 and S2 are normal without any obvious murmurs, rubs or gallops. RESPIRATORY:  Both lung fields are clear to auscultation.  No rales, retractions or  rhonchi. ABDOMEN:  Firm with palpable stool masses.  Bowel sounds are infrequent. There is no focal tenderness and no organomegaly.  Bladder is impalpable.  Apparently an in and out catheterization done here has yielded about 300 mL of urine. EXTREMITIES:  No edema is palpable of either lower extremity.  No peculiar rashes noted.  Digits appear normal with some flexor contractures of the upper extremity.  LABORATORY DATA:  Hemoglobin 12.2, hematocrit 34.6, white cell count 10,200, platelet count 199,000.  Sodium 145, potassium 4.5, bicarbonate 15, BUN 104, creatinine 12.8, glucose 80, calcium 8.5.  Urinalysis, urine specific gravity 1.018, pH 5, clear appearance, negative blood, negative protein, negative leukocytes, negative nitrite.  ASSESSMENT AND PLAN: 1. Acute renal failure.  Unclear renal baseline, but per his wife     apparently has a normal renal function.  We will attempt to obtain     records tomorrow from [facility] he     apparently had labs done not too long ago.  From the history and     timeline of events, it appears that this is predominantly prerenal     azotemia with possible differentials including pigment associated     nephropathy from rhabdomyolysis (recent seizure-like activity) and     obstructive uropathy.  We will check his urinary electrolytes,     check a CPK level, and check a renal ultrasound.  Given the     urinalysis results, this is low likelihood that this is an acute     glomerulonephritis process and testing for this will be deferred     for now.  No acute electrolyte issues or volume indications are     noted to prompt the need for urgent renal replacement therapy.  We     will continue intravenous fluids given his anion gap acidosis and     hypernatremia as well as his history of poor oral intake in his     increased insensible losses from diaphoresis. 2. Anion gap metabolic acidosis.  This is likely from acute renal      failure.  I will go ahead and check a lactic acid level to rule     this out. 3. Hypernatremia.  A calculated 1.3-liter free water deficit is noted,     agree with hypotonic fluids for now x1 liter     and then switch over to normal saline. 4. Tremors/rigors in a patient with Parkinson disease.  Await     neurology input and check a CPK level to rule out any serotonergic     syndrome.     Zetta Bills, MD     JP/MEDQ  D:  06/24/2011  T:  06/25/2011  Job:  [id number]  cc:   Darin Hickman, M.D. Dr. Mallie Snooks  Electronically Signed by Zetta Bills  MD on 07/21/2011 04:18:42 PM

## 2011-07-21 NOTE — Discharge Summary (Signed)
[name], Hickman NO.:  [id number]  MEDICAL RECORD NO.:  [id number]  LOCATION:  4145                         FACILITY:  [facility]  PHYSICIAN:  Erick Colace, M.D.DATE OF BIRTH:  1935-07-11  DATE OF ADMISSION:  06/30/2011 DATE OF DISCHARGE:  07/14/2011                              DISCHARGE SUMMARY   DISCHARGE DIAGNOSES: 1. Parkinson disease with pseudo exacerbation and deconditioning 2. Orthostatic hypotension. 3. Depression. 4. Leukocytosis. 5. TNA resolved. 6. Urinary retention. 7. Dysphagia, improved.  HISTORY OF PRESENT ILLNESS:  Darin Hickman is a 76 year old male with history of Parkinson disease, chronic constipation, admitted on July 19 with voiding problems and decreased urine output and worsening of p.o. intake with lethargy and falls.  The patient with seizure-type activity with tremors and sedation at time of admission, workup done revealed rhabdomyolysis with CK 851, hyponatremia with sodium at 146, BUN 100, creatinine of 11.69.  Renal was consulted for input and recommended IV fluids for hydration as well as Foley for strict I's and O's.  Renal ultrasound done showed normal kidneys.  The patient continues with lethargy and low-grade fevers with leukocytosis.  Chest x-ray of July 21st  showed patchy bronchopneumonia at the medial right lung base and the patient was started on IV antibiotics for treatment.  MBS of July 23 showed severe residue and the patient was down to D1 diet thin liquids. Therapies initiated.  Currently, the patient requires cues for sequencing and motor planning, is requiring max facilitation for upright posture and motor planning.  The patient was evaluated by Rehab and we felt that he would benefit from a CIR program.  PAST MEDICAL HISTORY:  Significant for Parkinson disease and mild dementia, constipation, dyslipidemia, gait disorder with falls.  REVIEW OF SYMPTOMS:  Positive for incontinence, nocturia x2-3,  hesitancy as well as current weakness.  Occasional problems with swallowing solids at home.  FAMILY HISTORY:  Positive for CVA and hypertension.  SOCIAL HISTORY:  The patient is married, lives in 1-level home with three to four steps at entry.  Does not use any tobacco or alcohol. Family to assist pass discharge.  PHYSICAL EXAMINATION:  VITAL SIGNS:  Blood pressure 102/70, pulse 71, respiratory rate 20, and temperature 98.0. GENERAL:  The patient is a thin male, alert and pleasant with mass bases. HEENT:  Pupils equal, round, reactive to light.  Oral mucosa notable for partial dentures and dry mucosa. NECK:  Supple without JVD or lymphadenopathy. LUNGS:  Clear to auscultation bilaterally without wheezes, rales, or rhonchi. HEART:  Notable for systolic murmur.  Otherwise regular. ABDOMEN:  Soft, nontender with positive bowel sounds. EXTREMITIES:  No evidence of clubbing, cyanosis or edema. NEUROLOGIC:  Cranial nerves II-XII showed no focal abnormalities, mass bases.  Judgment fair, orientation intact.  Memory fair for short-term information.  The patient was oriented to date, day, and year with extra time.  Mood is pleasant.  Speech is soft, dysarthric and monotones.  The patient with mild cogwheel rigidity and mild intentional tremor.  No pronator drift.  Strength is grossly 4/5 in upper extremity proximal and distal.  Lower extremity strength is 3/5 proximally 4/5 distally.  HOSPITAL COURSE:  Darin Hickman was admitted to rehab on June 30, 2011 for inpatient therapies to consist of PT, OT and speech therapy at least 3-hours 5 days a week.  Past admission, physiatrist rehab RN and therapy team have worked together to provide customized collaborative interdisciplinary care.  The patient was maintained on IV Zosyn until on July 26, this was changed over to Augmentin 875 mg b.i.d. for 3 more days.  Followup chest x-ray done on July 26 showed no definite pneumonia, mild  peribronchial thickening.  Foley was discontinued past admission on July 26 and the patient was noted to have problems voiding requiring in-and-out caths.  He was started on low-dose Flomax 0.4 mg nightly, however, the patient with severe orthostasis on July 27, therefore Flomax was discontinued.  Abdominal binder and TEDs were ordered to help the patient with his symptomatology.  Labs were done to monitor hydration status.  The patient's BUN and creatinine at admission was 21 and 1.3.  Rehab RN has worked with the patient on pushing p.o. fluids to help the patient maintain his hydration.  Most recent labs of August 6 revealed sodium 140, potassium 3.7, chloride 104, CO2 of 28, BUN 17, creatinine 0.98, glucose 96.  UA/UC was done past admission on July 26 and had shown no growth.  Repeat of July 30, also showed no growth.  The patient continued to have issues with urinary retention. Therefore, Foley was placed to straight drain and he is to follow up with Urology for a repeat voiding trial.  Repeat UA was done on August 7 due to some issues with confusion, question due to insomnia.  The patient's blood pressures have been checked on b.i.d. basis.  Orthostatic blood pressures have been monitored throughout this stay.  The patient was noted to have continued drop in his blood pressure.  Therefore, abdominal binder and test were used throughout the stay and the patient was encouraged to lie in bed with head of bed elevated as much as possible.  Most recent orthostatic checks revealed supine blood pressure of 175/83 with blood pressures dropping to 150/61, and the patient being too weak to stand.  The repeat MBS was done on August 6, and the patient was advanced to D3 diet thin liquids.  The patient requires multiple dry swallows after each bite and sips; and hard cough after liquids and whole meds to clear of any silent penetration.   During the patient's stay in rehab, weekly team  conferences were held to monitor the patient's progress and goals as well as discuss barriers to discharge.  The patient has had issues with constipation that was treated with scheduled of MiraLax.  At admission, the patient was noted to have decreased endurance, impairments in mobility requiring mod to total assist plus 2 for all mobility.  He was noted to have problems with sequencing, decreased motor planning, impaired balance and reaction requiring mod assist for upright posture.  Mod assist for lower body dressing and for basic transfers.  The patient's progress has fluctuated during this stay due to issues with orthostatic hypotension as well as confusion at times and parkinsonism.  He continues to be inconsistent in regards to safety awareness.  He is currently requiring mod to max assist for all ADL tasks.  Physical Therapy has been working with the patient on strengthening, balance and mobility.  Currently, the patient is supervision for supine to sitting, min assist for stand pivot transfers due to the patient's instability and difficulty with initiating movement.  Noted to get orthostatic past prolong standing up of up to 8 minutes.  The patient is able to propel his wheelchair for 120 feet with min assist, needed for turns and for navigation.  Speech therapy has worked with the patient on dysphagia and characterized by severe residue as well as mild cognitive linguistic changes that from baseline.  Currently, the patient has made functional gains and swallow function and recall of need for safe swallow precautions.  The patient is still unclear on exact aspiration precautions and continues to require cuing for a basic self feeding and recall of information as well as increasing vocal intensity.  Secondary to the patient's variable progress as well as amount of assistance needed, family has elected on a SNF for further therapies.  Bed is available at [facility] on August 8, and the  patient is to be discharged to this facility with progressive PT, OT, speech therapy to continue past discharge.  DISCHARGE MEDICATIONS: 1. Symmetrel 100 mg p.o. at 12 noon and 1700 daily. 2. Remeron 7.5 mg p.o. nightly. 3. Comtan 200 mg at 8 a.m. and 1300 hours, 1700 hours, and 2100 hours     daily. 4. Sinemet 25/250 at 8 a.m., 1300 hours, 1700 hours, and 2100 hours     daily. 5. MiraLax 17 g in 8 ounces p.o. per day. 6. Dulcolax suppository 1 q.p.m. if no BM that day. 7. Artificial tears one GT OU b.i.d. 8. Protonix 40 mg p.o. per day. 9. Tylenol 325-650 mg p.o. per day p.r.n. pain. 10. Cipro 250 mg p.o. b.i.d.  DIET:  D3, thin liquids, full supervision by staff at meals. Meds whole. straws okay.  ACTIVITY LEVEL:  24 hours supervision and assistance.  SPECIAL INSTRUCTIONS:  Progressive PT, OT, speech therapy to continue past discharge.  Routine Foley care.  Routine pressure relief measures Supervision at meals to help the patient maintain aspiration precautions.  FOLLOWUP:  The patient to follow up with Dr. Wynn Banker as needed. Follow up with Jetta Lout, nurse practitioner at [facility] on August 17 at 9 a.m. and follow up with Dr. Anne Hahn for routine check.     Delle Reining, P.A.   ______________________________ Erick Colace, M.D.    PL/MEDQ  D:  07/13/2011  T:  07/13/2011  Job:  [id number]  cc:   Marlan Palau, M.D. [facility] Dr. Mallie Snooks  Electronically Signed by Delle Reining P.A. on 07/14/2011 02:26:02 PM Electronically Signed by Claudette Laws M.D. on 07/21/2011 09:46:56 AM

## 2011-07-24 NOTE — H&P (Signed)
NAME:  Darin Hickman, Darin Hickman NO.:  [id number]  MEDICAL RECORD NO.:  [id number]  LOCATION:  [facility]                         FACILITY:  [facility]  PHYSICIAN:  Homero Fellers, MD   DATE OF BIRTH:  10-29-1935  DATE OF ADMISSION:  06/24/2011 DATE OF DISCHARGE:                             HISTORY & PHYSICAL   PRIMARY CARE PHYSICIAN:  Unassigned.  CHIEF COMPLAINT:  Tremors, jerky movement.  HISTORY OF PRESENT ILLNESS:  This is a 76 year old gentleman with history of Parkinson disease and dementia who was brought by family because of increased tremors, jerky movements almost resembling seizure activity.  The patient has also not been voiding well according to wife and has been constipated for about 4-5 days.  There is no nausea or vomiting but p.o. intake has been significantly impaired.  In the emergency room, the patient was found to have a BUN of 100 and a creatinine of 11.69.  He had no prior history of kidney disease according to family members.  There is no recent lab work available for comparison.  The patient could not give any history, appeared confused, was having intermittent jerking movement during my evaluation.  There has been no fever, cough, report of chest pain, shortness of breath, abdominal pain or diarrhea.  PAST MEDICAL HISTORY: 1. Parkinson disease. 2. Dementia likely related to Parkinson's.  MEDICATIONS:  Sinemet, Comtan, amantadine, mirtazapine, also takes calcium and vitamin D.  ALLERGIES:  None.  SOCIAL HISTORY:  No smoking or alcohol use.  He lives with wife.  REVIEW OF SYSTEMS:  Ten-point review of systems is negative except as above.  PHYSICAL EXAMINATION:  VITAL SIGNS:  Blood pressure is 143/62, pulse 79- 102, respirations 18-30, temperature 98.6, O2 sat 95%. GENERAL:  The patient agitated with jerky movement involving both extremities.  This has improved with IV Ativan. MOUTH:  Dry. NECK:  Supple.  No JVD, adenopathy or  thyromegaly. LUNGS:  Clear anteriorly to auscultation.  No wheezing or crackles. HEART:  S1-S2.  No murmurs, rubs or gallops. ABDOMEN:  Full, soft, nontender.  Bowel sounds present.  No masses. EXTREMITIES:  No edema, clubbing or cyanosis. NEUROLOGIC:  The patient has intermittent jerky movements with tremors. No obvious seizure activity witnessed.  Tone slightly increased. SKIN:  No rash or lesion.  Warm and dry. PSYCHIATRIC:  Difficult to evaluate.  LABORATORY:  BUN again is 104, creatinine 12.8, potassium 4.5, bicarbonate 15.  Sodium 145-146, white count 11.6, hemoglobin 13.6, platelet count is 199.  Urinalysis within normal limits.  Chest x-ray showed no acute cardiopulmonary process.  Head CT was difficult to do because of the patient's uncooperativeness.  EKG sinus tachycardia with nonspecific ST changes.  ASSESSMENT:  This 76 year old man admitted with: 1. Acute kidney failure, etiology unclear at this time but possibility     include prerenal failure from dehydration, poorly complicated with     functional obstruction.  After Foley catheter was placed, almost 2     L of urine was drained from the bladder.  According to the wife,     the patient has had some problems with fecal impaction which is     probably impairing his  ability to pass urine. 2. Acidosis likely secondary to acute kidney failure. 3. Hypernatremia and clinical dehydration. 4. Parkinson disease with increased tremors and jerky movements which     is poorly complicated by kidney failure.  PLAN:  Admit to telemetry.  Keep n.p.o. until fully awake and cooperative.  Give IV fluids.  Has been seen by Nephrology who recommended half-normal saline with bicarbonate supplementation.  Renal ultrasound has been ordered as well as spot urine sodium, creatinine and protein.  We will also have uric acid level, ESR, UPEP and vitamin D 25 hydroxy level since the patient also has history of vitamin D deficiency and is on  vitamin D supplementation.  The patient's overall condition is guarded.  He is not a candidate for dialysis at this time.  Hopefully, his kidney function will get better with conservative measures.     Homero Fellers, MD     FA/MEDQ  D:  06/24/2011  T:  06/24/2011  Job:  [id number]  Electronically Signed by Homero Fellers  on 07/24/2011 06:24:12 AM

## 2011-07-24 NOTE — H&P (Signed)
  NAME:  ZAE, KIRTZ NO.:  [id number]  MEDICAL RECORD NO.:  [id number]  LOCATION:  [facility]                         FACILITY:  [facility]  PHYSICIAN:  Homero Fellers, MD   DATE OF BIRTH:  1935-12-06  DATE OF ADMISSION:  06/24/2011 DATE OF DISCHARGE:                             HISTORY & PHYSICAL   ADDENDUM  Please note that the patient's CK was 851.  Mild rhabdomyolysis with most differential in this patient.  Lactic acid was requested which came back only as 1.4 which is within normal limits.     Homero Fellers, MD     FA/MEDQ  D:  06/24/2011  T:  06/24/2011  Job:  [id number]  Electronically Signed by Homero Fellers  on 07/24/2011 06:24:15 AM

## 2011-08-02 ENCOUNTER — Other Ambulatory Visit: Payer: Self-pay | Admitting: Neurology

## 2011-08-02 DIAGNOSIS — R159 Full incontinence of feces: Secondary | ICD-10-CM

## 2011-08-02 DIAGNOSIS — R32 Unspecified urinary incontinence: Secondary | ICD-10-CM

## 2011-08-02 DIAGNOSIS — G20 Parkinson's disease: Secondary | ICD-10-CM

## 2011-08-02 DIAGNOSIS — R413 Other amnesia: Secondary | ICD-10-CM

## 2011-08-02 DIAGNOSIS — R269 Unspecified abnormalities of gait and mobility: Secondary | ICD-10-CM

## 2011-08-18 ENCOUNTER — Ambulatory Visit
Admission: RE | Admit: 2011-08-18 | Discharge: 2011-08-18 | Disposition: A | Discharge status: Home / Self Care | Payer: Medicare Other | Source: Ambulatory Visit | Attending: Neurology | Admitting: Neurology

## 2011-08-18 DIAGNOSIS — R269 Unspecified abnormalities of gait and mobility: Secondary | ICD-10-CM

## 2011-08-18 DIAGNOSIS — R413 Other amnesia: Secondary | ICD-10-CM

## 2011-08-18 DIAGNOSIS — G20 Parkinson's disease: Secondary | ICD-10-CM

## 2011-08-18 DIAGNOSIS — R32 Unspecified urinary incontinence: Secondary | ICD-10-CM

## 2011-08-18 DIAGNOSIS — R159 Full incontinence of feces: Secondary | ICD-10-CM

## 2011-08-26 ENCOUNTER — Emergency Department (HOSPITAL_COMMUNITY)
Admission: EM | Admit: 2011-08-26 | Discharge: 2011-08-26 | Disposition: A | Discharge status: Home / Self Care | Payer: Medicare Other | Attending: Emergency Medicine | Admitting: Emergency Medicine

## 2011-08-26 DIAGNOSIS — F068 Other specified mental disorders due to known physiological condition: Secondary | ICD-10-CM | POA: Insufficient documentation

## 2011-08-26 DIAGNOSIS — Z86718 Personal history of other venous thrombosis and embolism: Secondary | ICD-10-CM | POA: Insufficient documentation

## 2011-08-26 DIAGNOSIS — G20A1 Parkinson's disease without dyskinesia, without mention of fluctuations: Secondary | ICD-10-CM | POA: Insufficient documentation

## 2011-08-26 DIAGNOSIS — Z79899 Other long term (current) drug therapy: Secondary | ICD-10-CM | POA: Insufficient documentation

## 2011-08-26 DIAGNOSIS — G20 Parkinson's disease: Secondary | ICD-10-CM | POA: Insufficient documentation

## 2011-08-26 LAB — CBC
HCT: 40.3 % (ref 39.0–52.0)
Hemoglobin: 13.8 g/dL (ref 13.0–17.0)
RBC: 4.48 MIL/uL (ref 4.22–5.81)
WBC: 7.5 10*3/uL (ref 4.0–10.5)

## 2011-08-26 LAB — DIFFERENTIAL
Basophils Absolute: 0 10*3/uL (ref 0.0–0.1)
Lymphocytes Relative: 17 % (ref 12–46)
Neutro Abs: 5.4 10*3/uL (ref 1.7–7.7)
Neutrophils Relative %: 72 % (ref 43–77)

## 2011-08-26 LAB — POCT I-STAT, CHEM 8
Chloride: 106 mEq/L (ref 96–112)
Creatinine, Ser: 1.4 mg/dL — ABNORMAL HIGH (ref 0.50–1.35)
Glucose, Bld: 98 mg/dL (ref 70–99)
Potassium: 3.7 mEq/L (ref 3.5–5.1)

## 2011-08-28 NOTE — Consult Note (Signed)
NAME:  ONEY, FOLZ NO.:  [id number]  MEDICAL RECORD NO.:  [id number]  LOCATION:  [facility]                         FACILITY:  [facility]  PHYSICIAN:  Weston Settle, MD   DATE OF BIRTH:  Mar 03, 1935  DATE OF CONSULTATION:  08/26/2011 DATE OF DISCHARGE:                                CONSULTATION   REQUESTING PHYSICIAN:  Nelva Nay, MD  REASON FOR CONSULTATION:  Questionable seizure.  HISTORY OF PRESENT ILLNESS:  The patient is a 76 year old white male with a 15-year diagnosis of Parkinson disease with dementia.  The patient was at a nursing home and was transferred here because of some ill-defined episodes of staring and shaking, thought to be possible seizure activity.  No good description is available and the wife who is with him did not see the event in detail.  The patient has also been quite agitated intermittently during the nursing home hospitalization and attempts to get out of the bed and they are using Ativan for that which makes him too sleepy and sometimes is counterproductive in making him more agitated.  No specific changes to his Parkinson medications have been made recently.  His CBC is normal. His basic metabolic panel is normal.  PAST MEDICAL HISTORY: 1. Parkinson disease. 2. Parkinson disease related dementia. 3. Chronic constipation. 4. Agitation secondary to Parkinson disease.  PAST SURGICAL HISTORY:  Negative.  CURRENT MEDICATIONS: 1. Sinemet 25/250 one tablet 4 times a day. 2. Comtan 200 mg 4 times a day with each Sinemet dose. 3. MiraLax 17 grams daily. 4. Protonix 40 mg daily. 5. Amantadine 100 mg b.i.d. 6. Remeron 15 mg at bedtime. 7. Ativan 0.5 mg p.r.n. agitation.  ALLERGIES:  No known drug allergies.  SOCIAL HISTORY:  No history of smoking, alcohol, or illicit drugs.  FAMILY HISTORY:  Noncontributory.  REVIEW OF SYSTEMS:  Unobtainable because of the patient's mental status.  PHYSICAL EXAMINATION:  VITAL SIGNS:   Blood pressure is 150/76, pulse of 86, and he is a febrile. HEART:  Regular rate and rhythm.  S1-S2.  No murmurs. LUNGS:  Clear to auscultation. NECK:  There are no carotid bruits. NEUROLOGIC:  He is awake and alert.  He is fully oriented.  He is hypomanic and hypophonic.  Pupils are equal and reactive.  Extraocular movements are intact.  Face is symmetrical.  Tongue is midline.  He has bilateral resting tremors at the 4-6 Hz frequency, worse on the left. There is mild rigidity present and cogwheeling.  He has bradykinesia in the upper and lower extremities.  Coordination of finger-to-nose is intact.  Sensation is grossly intact.  There is no Babinski sign.  There is no Hoffman sign.  There are no skin rashes.  Gait testing was deferred at this time due to safety reasons.  IMPRESSION/PLAN:  This is a 76 year old with a long history of Parkinson disease.  There are some ill-defined episodes in the nursing home which are being labeled as seizures but I am not convinced.  There is no specific correlation with Parkinson disease and seizures.  If he does have Lewy body dementia, then perhaps seizure activity can be a result of that particular condition.  He does have some hallucinations according to the wife and it is not clear whether these are Lewy body dementia related or related to his dopaminergic therapy.  At this point, I do not think he needs to be admitted to the hospital but an outpatient EEG will help [place] determine if he has any underlying seizure tendency. At this point, I do not recommend any antiepileptic drug treatment.  Some of his symptomatology may be related to the progression of his Parkinson disease.  I recommend adding another dose of Sinemet 25/250 at midnight.  This can be concomitantly taken with another dose of Comtan at 200 mg.  In terms of his Parkinson disease related dementia and agitation, I recommend discontinuing Ativan and using Seroquel 25 mg as needed  to control that.  This is an atypical antipsychotic and has minimal basal ganglial binding and therefore is not likely to worsen his Parkinson disease.  The patient should follow up with his regular neurologist, Dr. Anne Hahn, in 2 weeks.          ______________________________ Weston Settle, MD     SE/MEDQ  D:  08/26/2011  T:  08/26/2011  Job:  [id number]  Electronically Signed by Weston Settle MD on 08/28/2011 03:48:41 PM

## 2011-09-16 ENCOUNTER — Emergency Department (HOSPITAL_COMMUNITY)
Admission: EM | Admit: 2011-09-16 | Discharge: 2011-09-16 | Disposition: A | Discharge status: Home / Self Care | Payer: Medicare Other | Attending: Emergency Medicine | Admitting: Emergency Medicine

## 2011-09-16 DIAGNOSIS — R319 Hematuria, unspecified: Secondary | ICD-10-CM | POA: Insufficient documentation

## 2011-09-16 DIAGNOSIS — G3183 Dementia with Lewy bodies: Secondary | ICD-10-CM | POA: Insufficient documentation

## 2011-09-16 DIAGNOSIS — F0280 Dementia in other diseases classified elsewhere without behavioral disturbance: Secondary | ICD-10-CM | POA: Insufficient documentation

## 2011-09-16 DIAGNOSIS — Z66 Do not resuscitate: Secondary | ICD-10-CM | POA: Insufficient documentation

## 2011-09-16 DIAGNOSIS — N390 Urinary tract infection, site not specified: Secondary | ICD-10-CM | POA: Insufficient documentation

## 2011-09-16 DIAGNOSIS — F29 Unspecified psychosis not due to a substance or known physiological condition: Secondary | ICD-10-CM | POA: Insufficient documentation

## 2011-09-16 LAB — URINALYSIS, ROUTINE W REFLEX MICROSCOPIC
Bilirubin Urine: NEGATIVE
Glucose, UA: NEGATIVE mg/dL
Specific Gravity, Urine: 1.021 (ref 1.005–1.030)
pH: 7 (ref 5.0–8.0)

## 2011-09-16 LAB — POCT I-STAT, CHEM 8
BUN: 14 mg/dL (ref 6–23)
Calcium, Ion: 1.11 mmol/L — ABNORMAL LOW (ref 1.12–1.32)
Chloride: 104 mEq/L (ref 96–112)
Glucose, Bld: 98 mg/dL (ref 70–99)
Potassium: 3.8 mEq/L (ref 3.5–5.1)

## 2011-09-16 LAB — URINE MICROSCOPIC-ADD ON

## 2011-09-17 LAB — URINE CULTURE

## 2012-06-13 IMAGING — CT CT HEAD W/O CM
1 of 2 series · 13 of 30 positions shown, 17 images · non-contrast
Comparison: None.

CLINICAL DATA: Dementia.  Parkinson's.  Sleeping a lot.

CT HEAD WITHOUT CONTRAST
TECHNIQUE: Contiguous axial images were obtained from the base of
the skull through the vertex without contrast.

[Series 2: brain · axial · 0.47mm/px · z∈[+99,+244]mm · 13 of 48 slices shown, 17 images]
[im 4/48  brain]
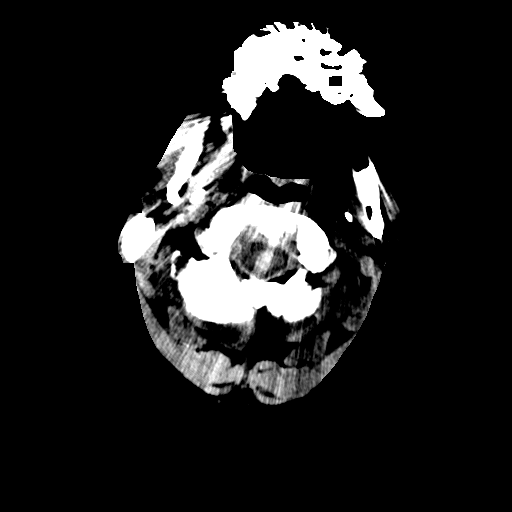
[im 4/48  bone]
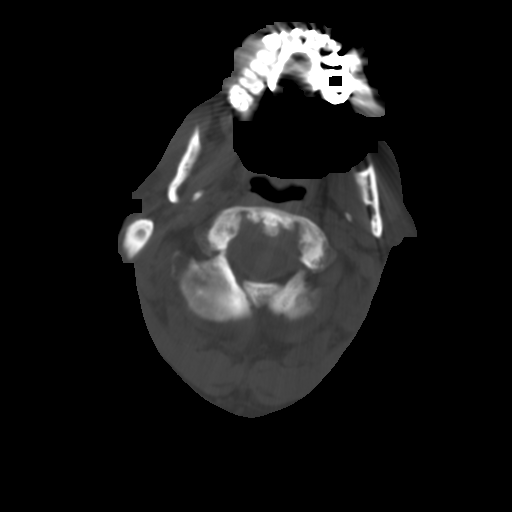
[im 7/48  brain]
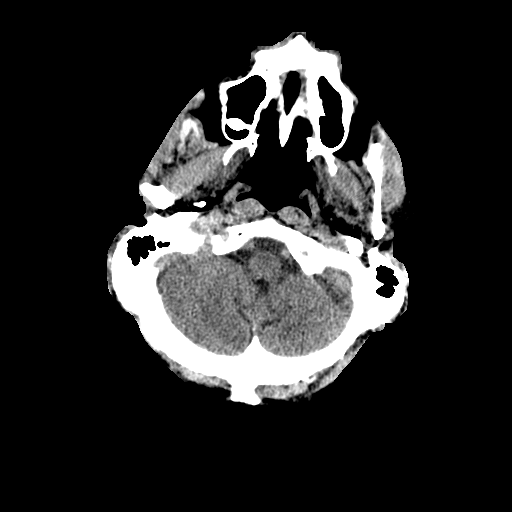
[im 11/48  brain]
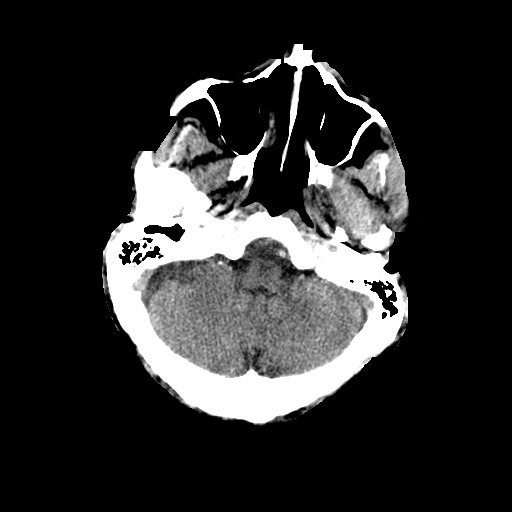
[im 14/48  brain]
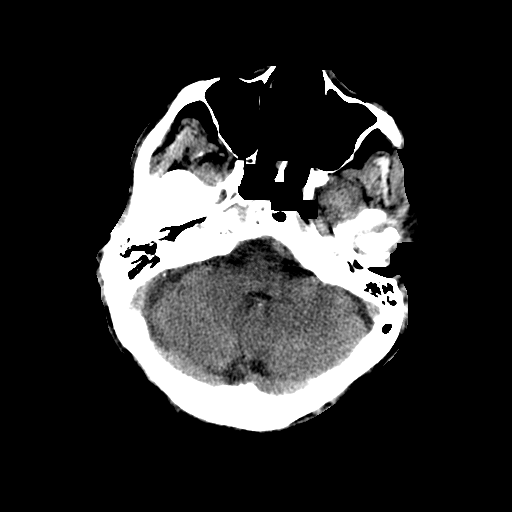
[im 17/48  brain]
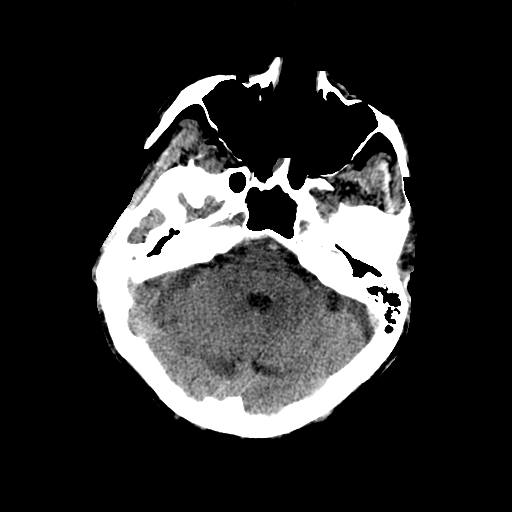
[im 17/48  bone]
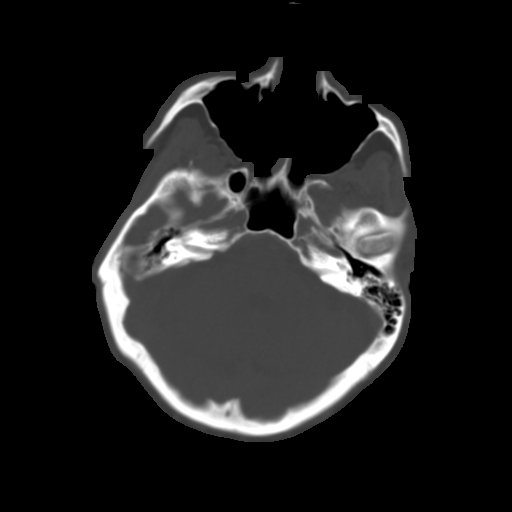
[im 21/48  brain]
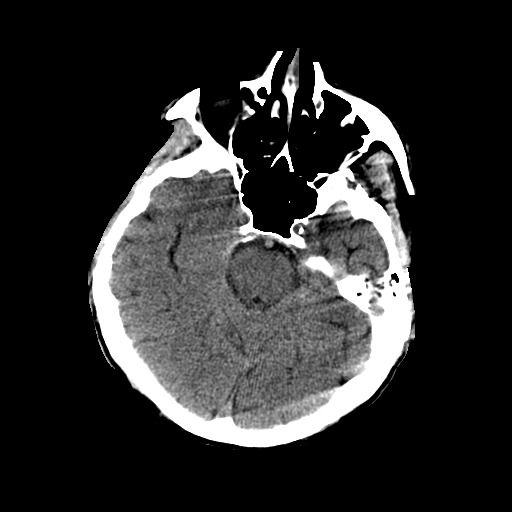
[im 24/48  brain]
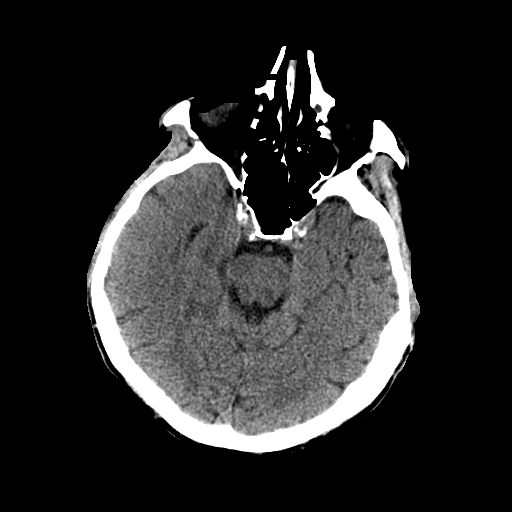
[im 27/48  brain]
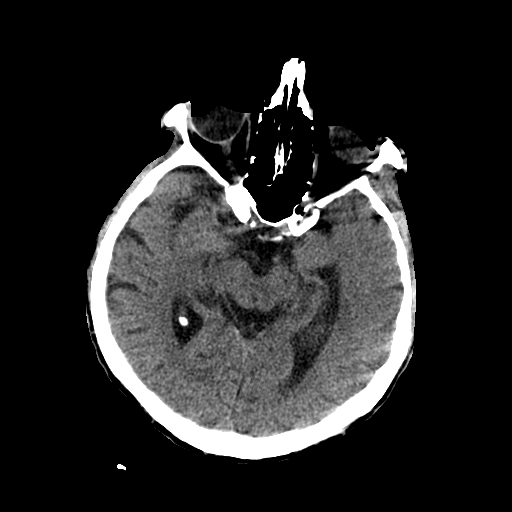
[im 31/48  brain]
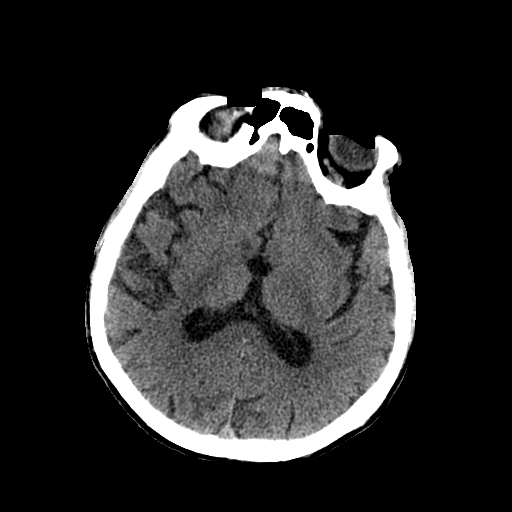
[im 31/48  bone]
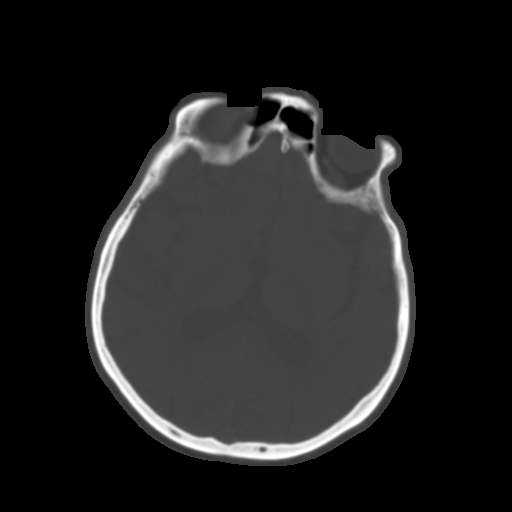
[im 34/48  brain]
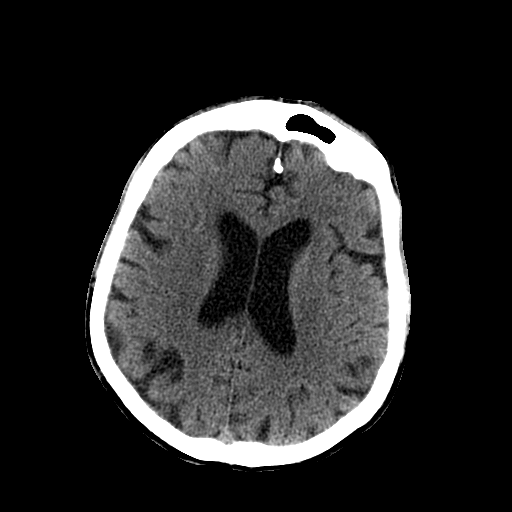
[im 37/48  brain]
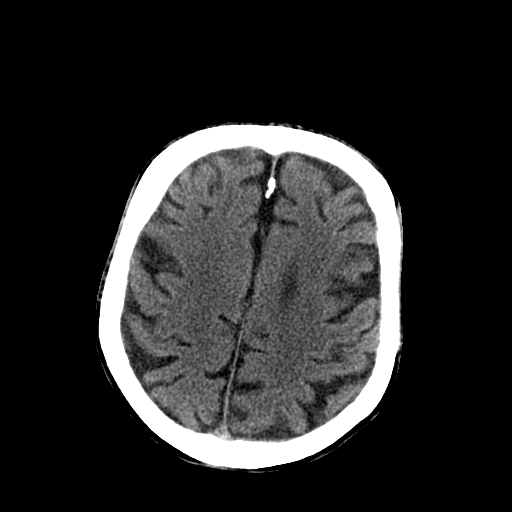
[im 41/48  brain]
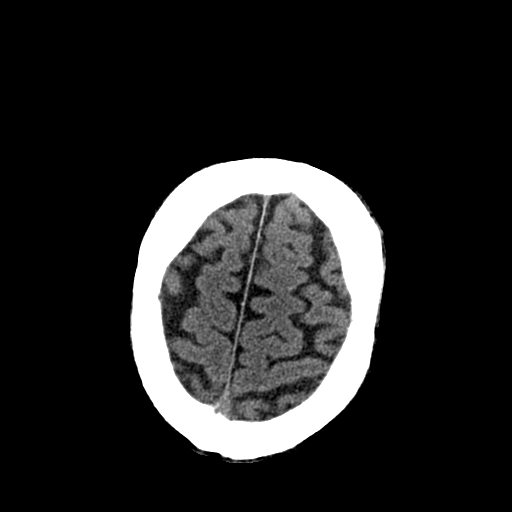
[im 44/48  brain]
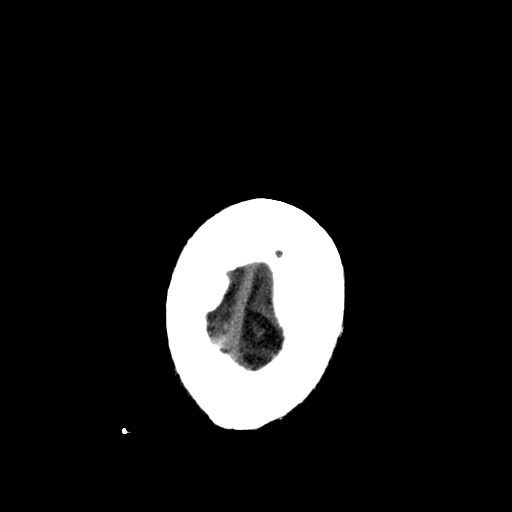
[im 44/48  bone]
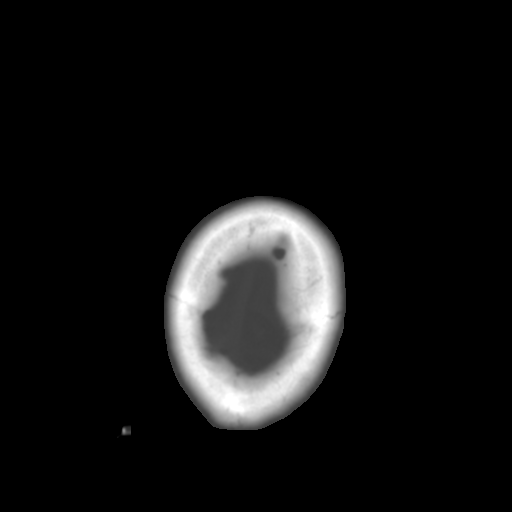

[13 of 30 positions shown; findings below may reference images not displayed]

FINDINGS: Small vessel disease type changes.

No CT evidence of large acute infarct.  Small acute infarct cannot
be excluded by CT.

No intracranial hemorrhage.

No intracranial mass lesion detected on this unenhanced exam.

Mild global atrophy without hydrocephalus.

Vascular calcifications.
IMPRESSION: No intracranial hemorrhage or CT evidence of large acute infarct.

## 2012-06-14 IMAGING — US US RENAL
1 series · 14 of 25 positions shown · non-contrast
Comparison: None.

CLINICAL DATA: Renal failure.

RENAL/URINARY TRACT ULTRASOUND COMPLETE

[Series 1: us renal · 0.20mm/px · 14 of 29 slices shown]
[im 1/29]
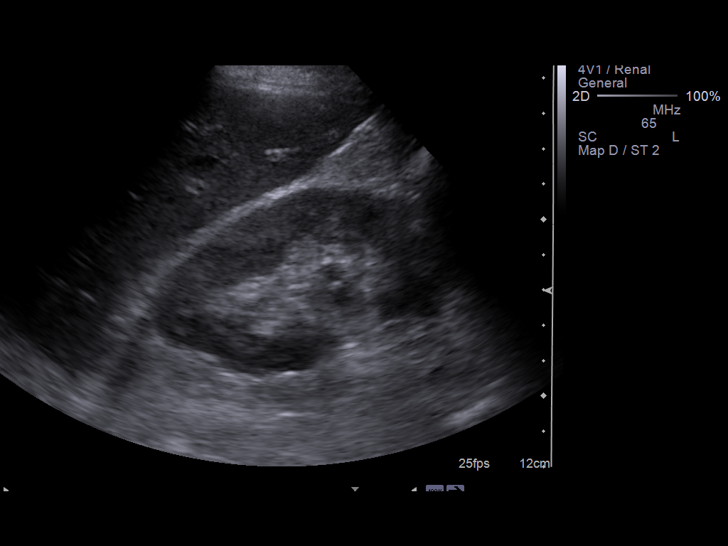
[im 3/29]
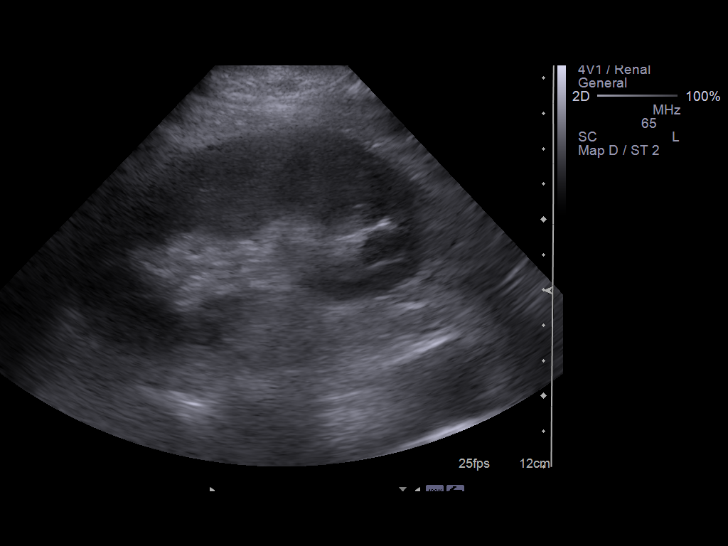
[im 5/29]
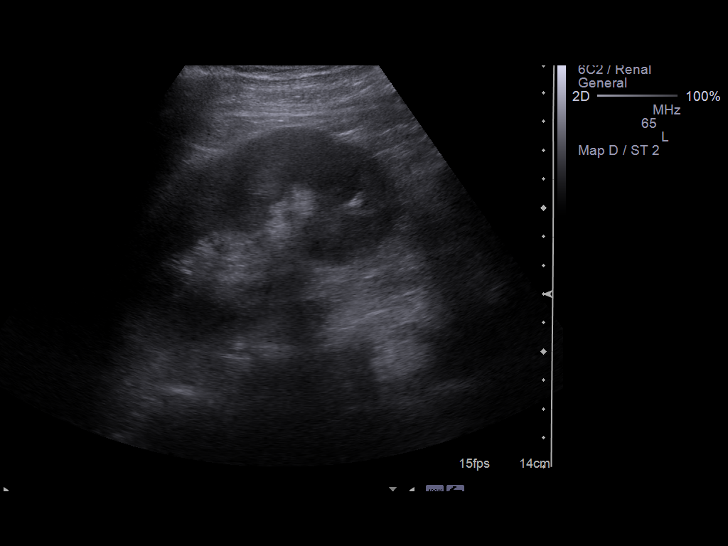
[im 8/29]
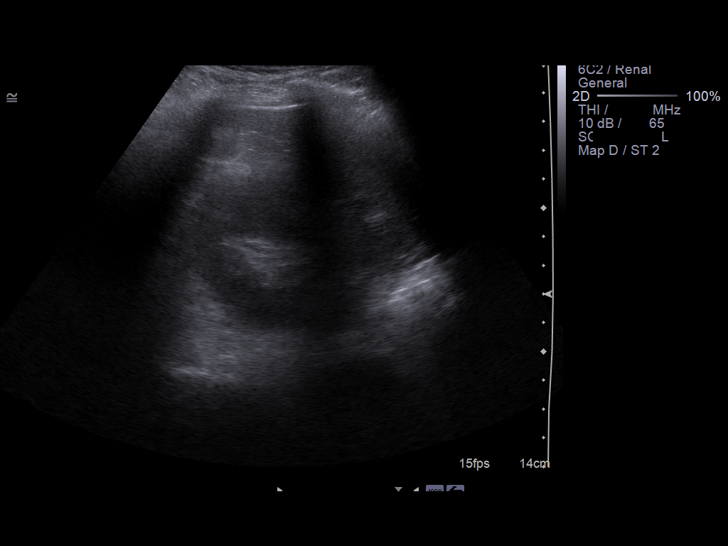
[im 10/29]
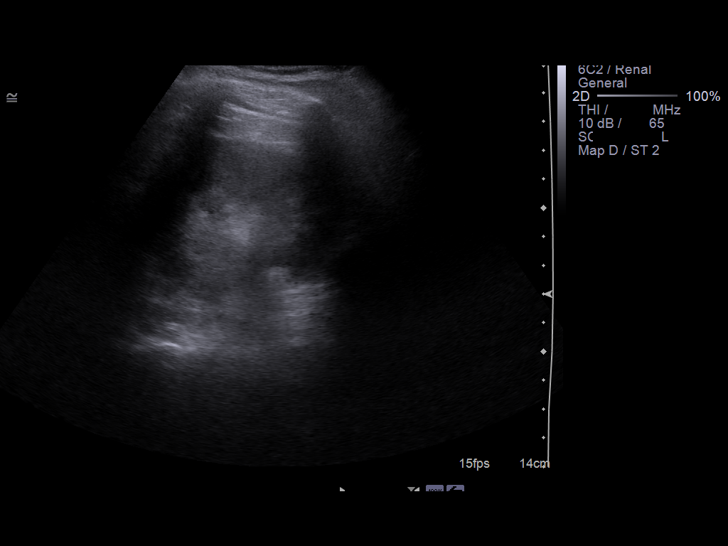
[im 11/29]
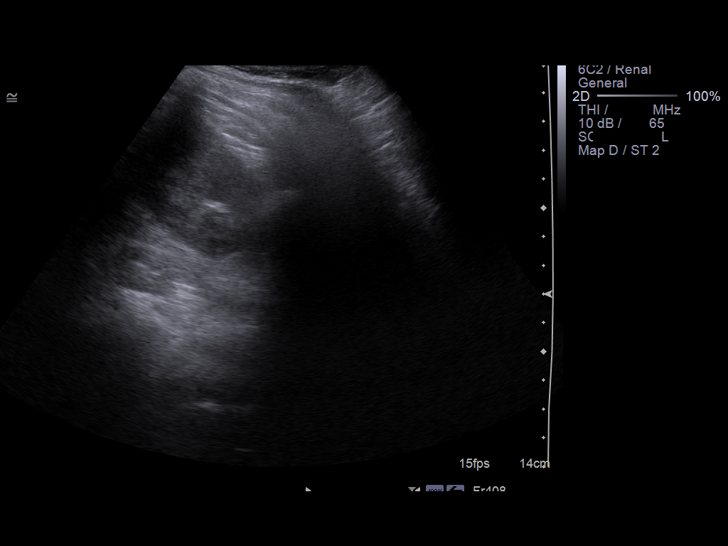
[im 13/29]
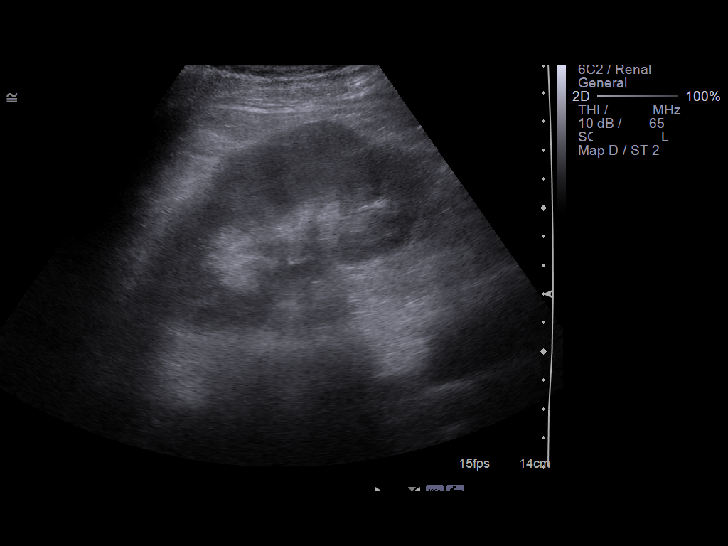
[im 16/29]
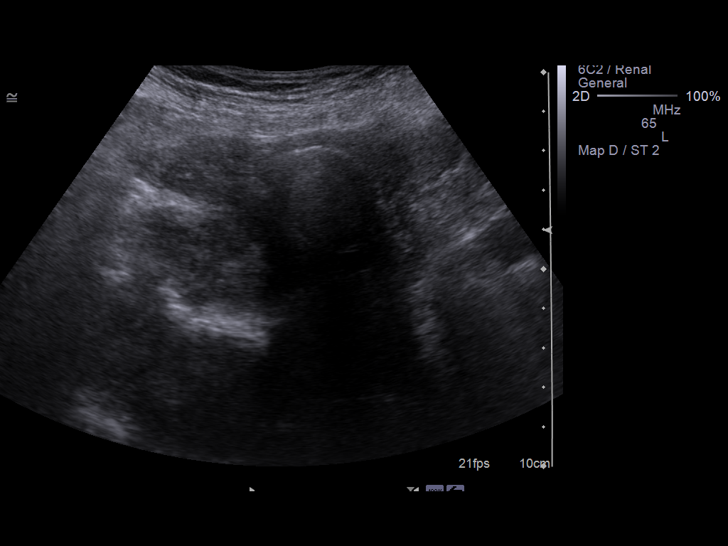
[im 18/29]
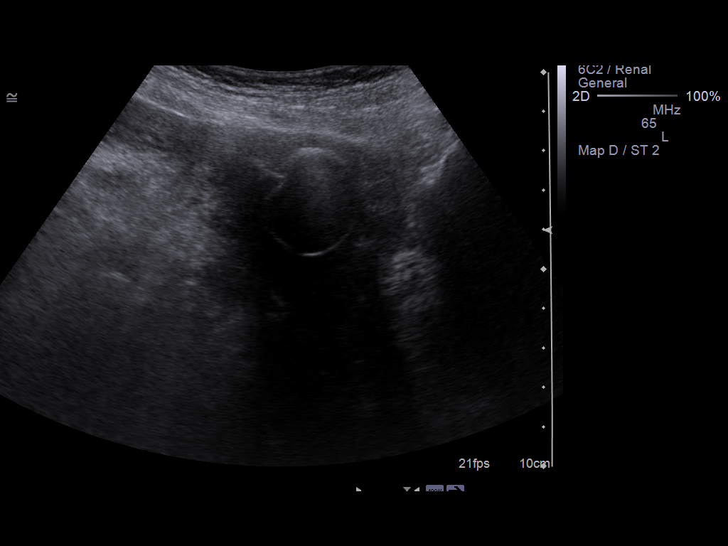
[im 19/29]
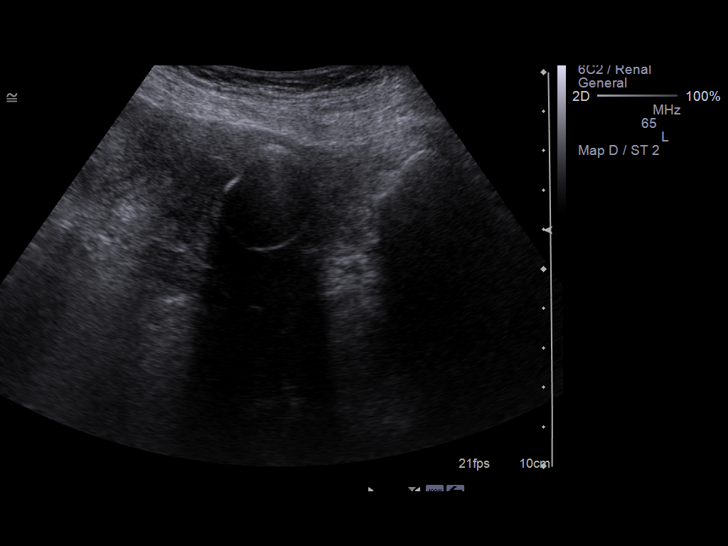
[im 22/29]
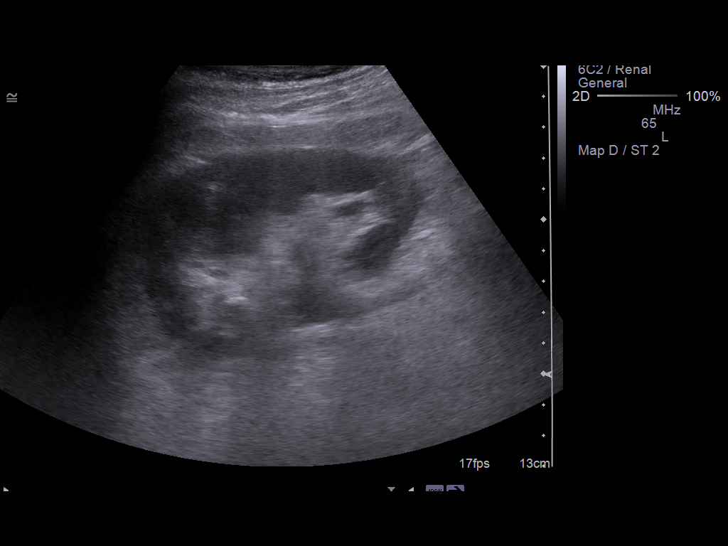
[im 24/29]
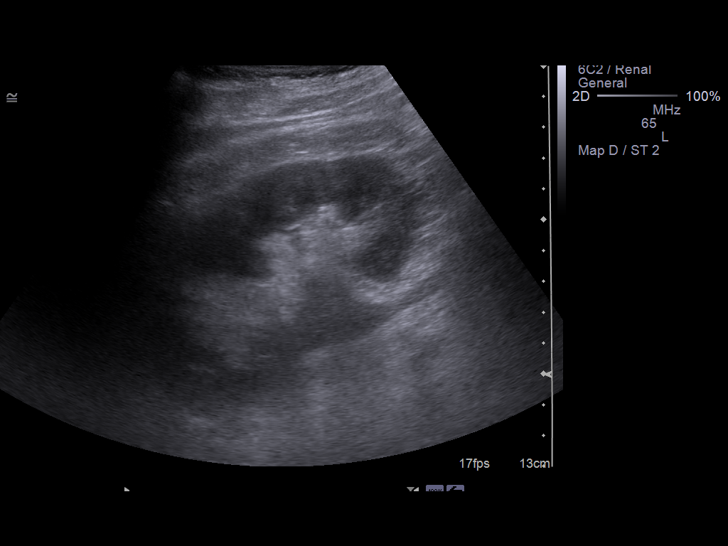
[im 26/29]
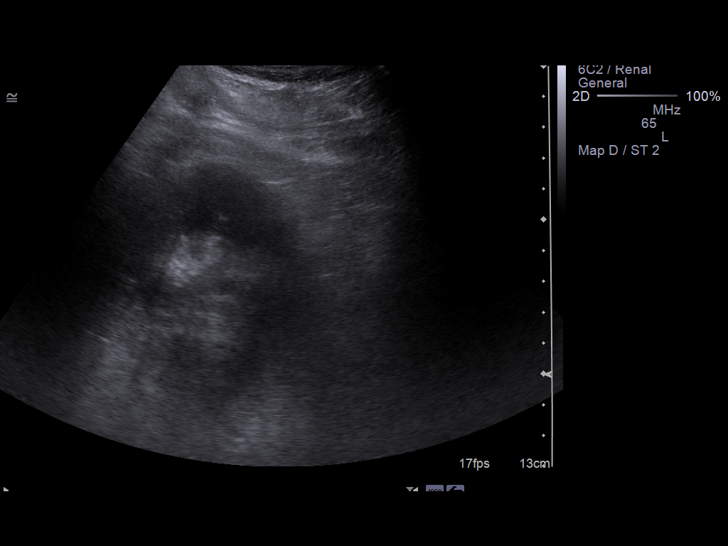
[im 29/29]
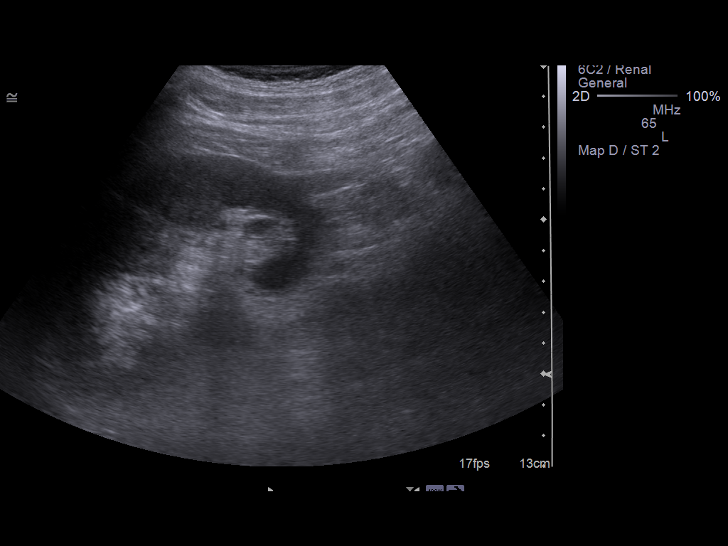

[14 of 25 positions shown; findings below may reference images not displayed]

FINDINGS: Right Kidney:  Normal.  10.2 cm in length.

Left Kidney:  Normal.  10.7 cm in length.

Bladder:  Empty.  Foley catheter in place.
IMPRESSION: Normal bilateral renal ultrasound.

## 2012-06-20 IMAGING — CR DG CHEST 2V
1 series · 1 of 1 positions shown · non-contrast
Comparison: Portable chest x-ray of 06/26/2011

CLINICAL DATA: Pneumonia, follow-up

CHEST - 2 VIEW

[view not recorded]
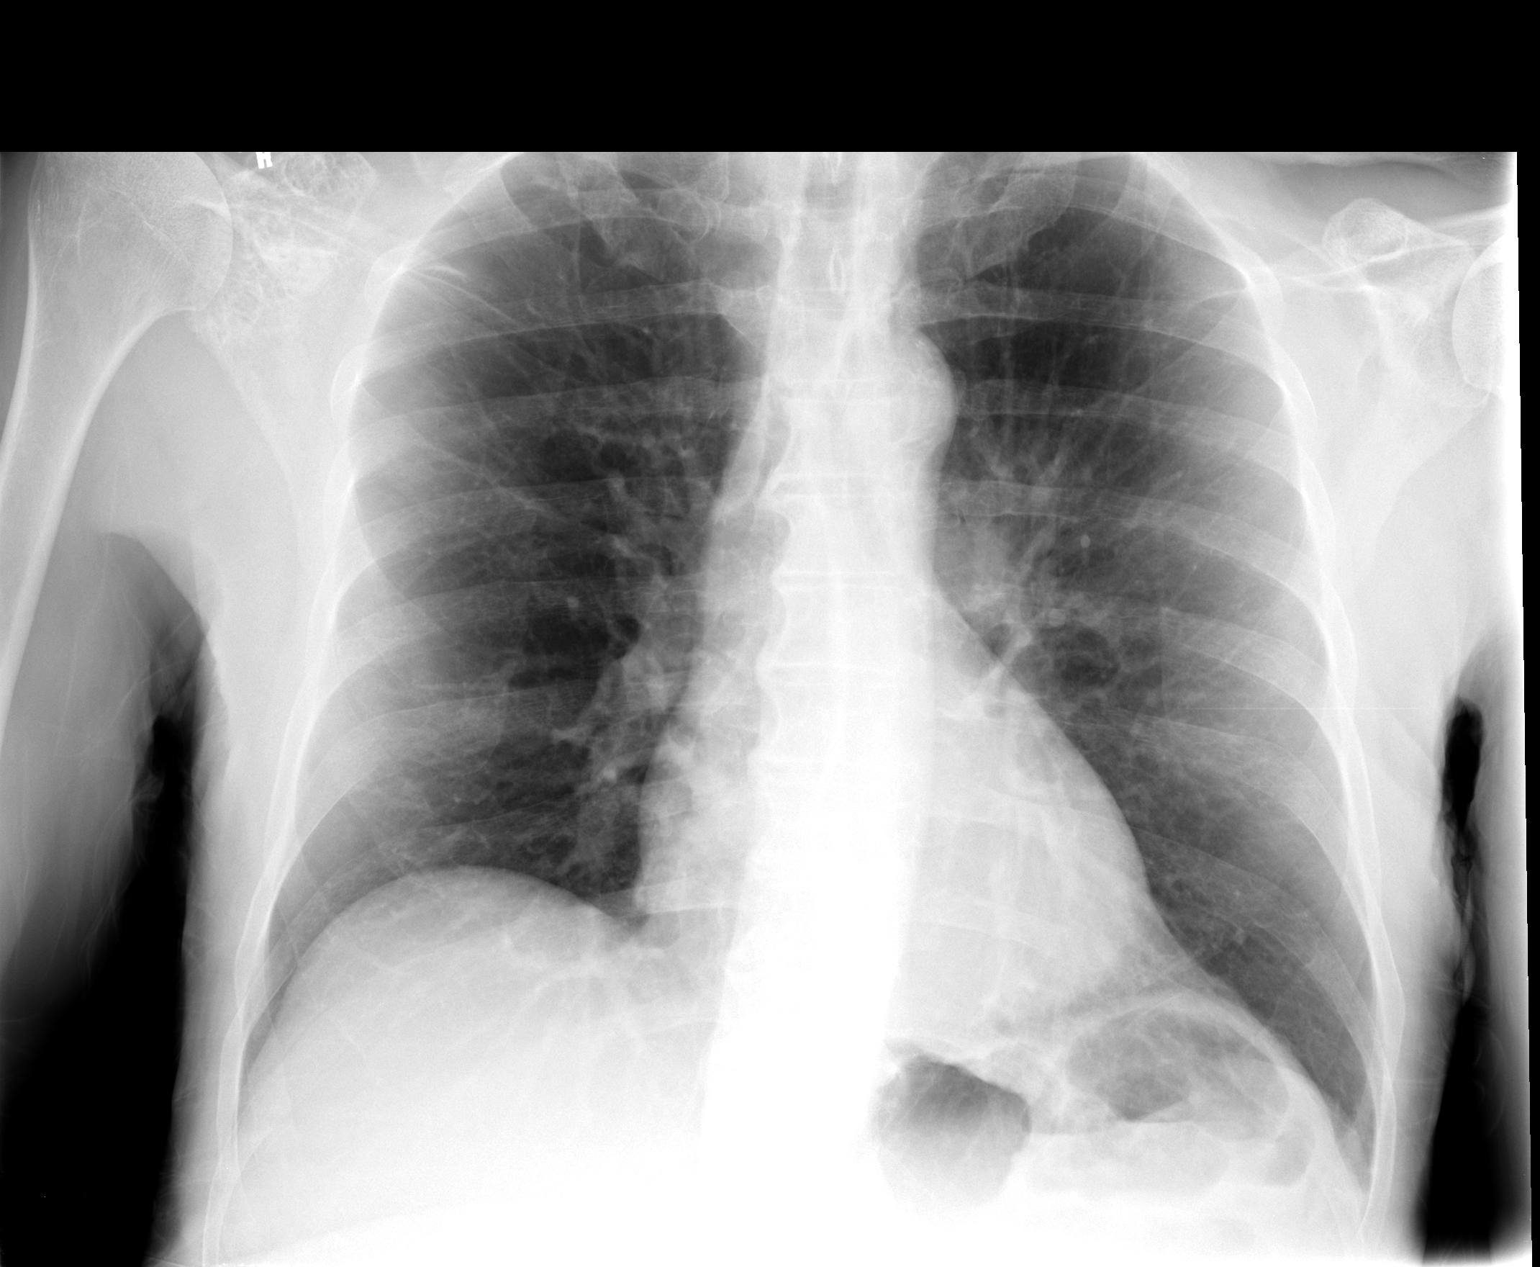

[1 of 1 positions shown; findings below may reference images not displayed]

FINDINGS: No definite pneumonia is seen.  Mild peribronchial
thickening is noted.  The heart is mildly enlarged and stable.
There are degenerative changes throughout the thoracic spine and
within the shoulder.
IMPRESSION: No definite pneumonia.  Mild peribronchial thickening.

## 2013-07-23 ENCOUNTER — Telehealth: Payer: Self-pay | Admitting: Neurology

## 2013-07-23 ENCOUNTER — Encounter: Payer: Self-pay | Admitting: Neurology

## 2013-07-25 ENCOUNTER — Encounter: Payer: Self-pay | Admitting: Neurology

## 2013-07-25 ENCOUNTER — Ambulatory Visit (INDEPENDENT_AMBULATORY_CARE_PROVIDER_SITE_OTHER): Payer: Medicare Other | Admitting: Neurology

## 2013-07-25 ENCOUNTER — Other Ambulatory Visit: Payer: Self-pay | Admitting: Neurology

## 2013-07-25 VITALS — BP 99/56 | HR 90 | Ht 70.0 in | Wt 138.0 lb

## 2013-07-25 DIAGNOSIS — G20A1 Parkinson's disease without dyskinesia, without mention of fluctuations: Secondary | ICD-10-CM

## 2013-07-25 DIAGNOSIS — R269 Unspecified abnormalities of gait and mobility: Secondary | ICD-10-CM | POA: Insufficient documentation

## 2013-07-25 DIAGNOSIS — G20 Parkinson's disease: Secondary | ICD-10-CM

## 2013-07-25 DIAGNOSIS — M47812 Spondylosis without myelopathy or radiculopathy, cervical region: Secondary | ICD-10-CM

## 2013-07-25 DIAGNOSIS — R413 Other amnesia: Secondary | ICD-10-CM

## 2013-07-25 HISTORY — DX: Other amnesia: R41.3

## 2013-07-25 MED ORDER — DIVALPROEX SODIUM 125 MG PO CPSP: ORAL_CAPSULE | ORAL | Status: DC

## 2013-07-25 MED ORDER — CARBIDOPA-LEVODOPA 25-250 MG PO TABS: ORAL_TABLET | ORAL | Status: DC

## 2013-07-25 NOTE — Progress Notes (Signed)
Reason for visit: Parkinson's disease  Darin Hickman is an 78 y.o. male  History of present illness:  Darin Hickman is a 78 year old right-handed white male with a history of Parkinson's disease and a gait disorder. The patient currently is residing in an extended care facility, and he indicates that he is walking regularly with assistance. The patient uses a walker, and there is someone behind him with a PT belt around him. The patient has not had any falls. The patient has recently had a urinary tract infection. The patient has an indwelling catheter in the bladder. The patient also reports some problems over the last several weeks with right shoulder and neck discomfort. The patient is getting neuromuscular therapy for this. The patient occasionally will have vivid dreams at night, and significant tremors in the evening hours. The patient reports double vision intermittently, most likely to occur with fatigue. Lab work done a year ago for double vision showed no other etiology for this problem. The patient comes to this office for an evaluation.  Past Medical History  Diagnosis Date  . Full incontinence of feces   . Unspecified urinary incontinence   . Memory loss   . Abnormality of gait   . Paralysis agitans   . Memory loss   . Memory deficit 07/25/2013  . Diplopia   . DVT (deep venous thrombosis)     Left thigh  . Dyslipidemia   . REM sleep behavior disorder     Past Surgical History  Procedure Laterality Date  . Cholecystectomy      Family History  Problem Relation Age of Onset  . Cancer Mother   . Heart disease Father   . Diabetes Sister     Social history:  reports that he has never smoked. He does not have any smokeless tobacco history on file. He reports that he does not drink alcohol or use illicit drugs.   No Known Allergies  Medications:  No current outpatient prescriptions on file prior to visit.   No current facility-administered medications on file prior to  visit.    ROS:  Out of a complete 14 system review of symptoms, the patient complains only of the following symptoms, and all other reviewed systems are negative.  Trouble swallowing Blurred vision, double vision Shortness of breath, snoring Incontinence of bowel and bladder, constipation, blood in urine Confusion, headache, tremor  Blood pressure 99/56, pulse 90, height 5\' 10"  (1.778 m), weight 138 lb (62.596 kg).  Physical Exam  General: The patient is alert and cooperative at the time of the examination. Masking of the face is seen.  Skin: No significant peripheral edema is noted.   Neurologic Exam  Mental status: Mini-Mental status examination done today shows a total score of 16/30.  Cranial nerves: Facial symmetry is present. Speech is dysphonic, not aphasic Extraocular movements are full. Visual fields are full.  Motor: The patient has good strength in all 4 extremities.  Coordination: The patient has good finger-nose-finger and heel-to-shin bilaterally. A resting tremor seen on the left upper extremity on occasion.  Gait and station: The patient is able to stand up with some assistance. The patient requires assistance with walking, balance is poor. Tandem gait was not attempted. Romberg is positive, the patient falls backwards. No drift is seen.  Reflexes: Deep tendon reflexes are symmetric.   Assessment/Plan:  1. Parkinson's disease  2. Gait disorder  3. Memory disorder  4. Neuromuscular discomfort, right shoulder and neck  The patient will be  set up for an epidural steroid injection of the cervical spine. The patient will continue his neuromuscular therapy. The patient will get an occluder for the glasses to prevent double vision when it does occur. This is an intermittent problem. The patient will be maintained on his current dose of amantadine and Sinemet. The patient is on a 25/250 mg tablets taking 1 tablet in the morning, one half tablet at 1 PM, 5 PM,  and one half tablet at 9 PM. The patient will followup in 6 months.  Marlan Palau MD 07/25/2013 7:55 PM  [facility] [address] [place], [place] [address]  Phone [phone] Fax [phone]

## 2013-08-01 ENCOUNTER — Ambulatory Visit
Admission: RE | Admit: 2013-08-01 | Discharge: 2013-08-01 | Disposition: A | Discharge status: Home / Self Care | Payer: PRIVATE HEALTH INSURANCE | Source: Ambulatory Visit | Attending: Neurology | Admitting: Neurology

## 2013-08-01 VITALS — BP 127/61 | HR 90

## 2013-08-01 DIAGNOSIS — R269 Unspecified abnormalities of gait and mobility: Secondary | ICD-10-CM

## 2013-08-01 DIAGNOSIS — M47812 Spondylosis without myelopathy or radiculopathy, cervical region: Secondary | ICD-10-CM

## 2013-08-01 MED ORDER — TRIAMCINOLONE ACETONIDE 40 MG/ML IJ SUSP (RADIOLOGY)
60.0000 mg | Freq: Once | INTRAMUSCULAR | Status: AC
  Administered 2013-08-01: 60 mg via EPIDURAL

## 2013-08-01 MED ORDER — IOHEXOL 180 MG/ML  SOLN: 1.0000 mL | Freq: Once | INTRAMUSCULAR | Status: AC | PRN

## 2013-08-01 MED ORDER — METHYLPREDNISOLONE ACETATE 40 MG/ML INJ SUSP (RADIOLOG: 120.0000 mg | Freq: Once | INTRAMUSCULAR | Status: DC

## 2013-08-01 MED ORDER — IOHEXOL 300 MG/ML  SOLN
1.0000 mL | Freq: Once | INTRAMUSCULAR | Status: AC | PRN
  Administered 2013-08-01: 1 mL via EPIDURAL

## 2013-08-17 ENCOUNTER — Telehealth: Payer: Self-pay | Admitting: Neurology

## 2013-08-17 NOTE — Telephone Encounter (Signed)
I called patient. The patient has increased tremors. The patient is quite demented, we cannot use Artane or Cogentin. We will try Benadryl, 12.5 mg 3 times daily to see this helps. I talked with a caretaker.

## 2013-08-17 NOTE — Telephone Encounter (Signed)
Spoke with Ebony Cargo a NP with [facility].  She states the patient has been having an increase in tremors, having a hard time holding things and feeding himself.  She would like to speak to doctor in regards to patient's Parkinson's medication.  [phone]

## 2013-09-19 ENCOUNTER — Telehealth: Payer: Self-pay | Admitting: Neurology

## 2013-09-19 NOTE — Telephone Encounter (Addendum)
Spoke with nurse(Susan) and she said that patient was recently treated with a 10-day antibiotic, became unarousal afterwards, drew blood work, all came back normal.Dr Kevan Ny thinks it may possible be silent seizures,wants Dr Clarisa Kindred opinion.

## 2013-09-19 NOTE — Telephone Encounter (Signed)
I called concerning the patient. The patient had just completed a ten-day course of procedure in, and then he had a day where he was very sleepy, out of it, not able to take medications or eat or drink well. Currently, the patient is back to his baseline. Blood work around the time of the altered mental status was normal. I'll get a revisit the patient, we will consider getting an EEG study.

## 2013-09-25 ENCOUNTER — Ambulatory Visit (INDEPENDENT_AMBULATORY_CARE_PROVIDER_SITE_OTHER): Payer: PRIVATE HEALTH INSURANCE | Admitting: Neurology

## 2013-09-25 ENCOUNTER — Encounter: Payer: Self-pay | Admitting: Neurology

## 2013-09-25 VITALS — BP 102/51 | HR 81

## 2013-09-25 DIAGNOSIS — R269 Unspecified abnormalities of gait and mobility: Secondary | ICD-10-CM

## 2013-09-25 DIAGNOSIS — R413 Other amnesia: Secondary | ICD-10-CM

## 2013-09-25 DIAGNOSIS — G20 Parkinson's disease: Secondary | ICD-10-CM

## 2013-09-25 DIAGNOSIS — Z5181 Encounter for therapeutic drug level monitoring: Secondary | ICD-10-CM

## 2013-09-25 DIAGNOSIS — R4182 Altered mental status, unspecified: Secondary | ICD-10-CM

## 2013-09-25 NOTE — Progress Notes (Signed)
Reason for visit: Parkinson's disease  Darin Hickman is an 78 y.o. male  History of present illness:  Darin Hickman is a 78 year old right-handed white male with a history of Parkinson's disease, dementia, and a severe gait disorder. The wife indicates that over the last one month, he has had an alteration in mental status. The patient has had a waxing and waning course of mental status. The patient has been noted to have episodes of staring off, and the arms will fly up in the air. The patient has had some problems with a urinary tract infection, and subsequently had some problems with dehydration. The patient has fallen on 2 occasions, and on one occasion he did bump his head. The patient required IV fluids on 09/12/2013 for the dehydration. The patient will have good days and bad days with confusion, but the mental status remains altered from his baseline. The patient is on Depakote, and it is not clear that any ammonia levels have been checked recently. The patient comes to this office for an evaluation.  Past Medical History  Diagnosis Date  . Full incontinence of feces   . Unspecified urinary incontinence   . Memory loss   . Abnormality of gait   . Paralysis agitans   . Memory loss   . Memory deficit 07/25/2013  . Diplopia   . DVT (deep venous thrombosis)     Left thigh  . Dyslipidemia   . REM sleep behavior disorder     Past Surgical History  Procedure Laterality Date  . Cholecystectomy      Family History  Problem Relation Age of Onset  . Cancer Mother   . Heart disease Father   . Diabetes Sister     Social history:  reports that he has never smoked. He has never used smokeless tobacco. He reports that he does not drink alcohol or use illicit drugs.   No Known Allergies  Medications:  Current Outpatient Prescriptions on File Prior to Visit  Medication Sig Dispense Refill  . acetaminophen (TYLENOL) 325 MG tablet Take 650 mg by mouth every 6 (six) hours as needed for  pain.      Marland Kitchen acetic acid 0.25 % irrigation Irrigate with 0.25 application as directed as needed.      . beta carotene w/minerals (OCUVITE) tablet Take 1 tablet by mouth daily.      . bisacodyl (DULCOLAX) 10 MG suppository Place 10 mg rectally every 3 (three) days.      . carbidopa-levodopa (SINEMET IR) 25-250 MG per tablet 1 tablet at 9 AM, one half tablet at 1 PM, 5 PM, and 9 PM, brand-name medicine is medically necessary      . clonazePAM (KLONOPIN) 0.5 MG tablet Take 0.25 mg by mouth at bedtime.       . Cranberry 250 MG CAPS Take 250 mg by mouth daily.      . divalproex (DEPAKOTE SPRINKLES) 125 MG capsule 2 tablets in the morning, 3 tablets in the evening      . docusate sodium (COLACE) 100 MG capsule Take 100 mg by mouth 2 (two) times daily.      . ergocalciferol (VITAMIN D2) 50000 UNITS capsule Take 50,000 Units by mouth every 30 (thirty) days.      . flavoxATE (URISPAS) 100 MG tablet Take 100 mg by mouth 2 (two) times daily as needed.      Marland Kitchen HYDROcodone-acetaminophen (NORCO/VICODIN) 5-325 MG per tablet Take 5-325 tablets by mouth every 6 (six) hours as  needed.       . magnesium hydroxide (MILK OF MAGNESIA) 400 MG/5ML suspension Take 30 mLs by mouth daily.       . methocarbamol (ROBAXIN) 500 MG tablet Take 500 mg by mouth 2 (two) times daily as needed.      . Nystatin (NYAMYC) 100000 UNIT/GM POWD Apply 100,000 g topically as needed.      . pantoprazole (PROTONIX) 40 MG tablet Take 40 mg by mouth daily.      . polyethylene glycol (MIRALAX / GLYCOLAX) packet Take 17 g by mouth daily.      Marland Kitchen sulfamethoxazole-trimethoprim (BACTRIM,SEPTRA) 400-80 MG per tablet Take 1 tablet by mouth daily.        No current facility-administered medications on file prior to visit.    ROS:  Out of a complete 14 system review of symptoms, the patient complains only of the following symptoms, and all other reviewed systems are negative.  Fatigue Blurred vision Constipation Urinary incontinence Memory loss,  confusion, slurred speech, seizures, tremor Sleepiness  Blood pressure 102/51, pulse 81, weight 0 lb (0 kg).  Physical Exam  General: The patient is alert and cooperative at the time of the examination.  Skin: No significant peripheral edema is noted.   Neurologic Exam  Mental status: The Mini-Mental status examination done today shows a total score of 16/30.  Cranial nerves: Facial symmetry is present. Speech is normal, no aphasia or dysarthria is noted. Extraocular movements are full, with exception of some restriction of superior gaze. Visual fields are full.  Motor: The patient has good strength in all 4 extremities.  Sensory examination: Soft touch sensation is symmetric throughout.  Coordination: The patient has good finger-nose-finger and heel-to-shin bilaterally. The patient does have apraxia with the use of the extremities. No asterixis is seen with the arms outstretched.  Gait and station: The patient requires assistance with standing. Once up, the patient leans backwards, he is unable to stand up by himself. The patient could not be effectively ambulated.  Reflexes: Deep tendon reflexes are symmetric, but are depressed.   Assessment/Plan:  One. Parkinson's disease  2. Gait disorder  3. Dementia  4. Altered mental status  The patient has had an alteration in mental status recently. The patient has underlying dementia, but he also has had episodes of staring off, and flailing of the arms. The patient will be sent for an EEG study. The patient will be sent for blood work to check the Depakote level, and to check an ammonia level. The patient will be taken off of the Symmetrel. The patient will followup through this office in 3-4 months. In the future, a CT scan of the brain may be done no other etiology of his altered mental status is noted.  Marlan Palau MD 09/25/2013 7:53 PM  [facility] [address] [place], [place]  [address]  Phone [phone] Fax [phone]

## 2013-09-26 ENCOUNTER — Telehealth: Payer: Self-pay | Admitting: *Deleted

## 2013-09-26 NOTE — Telephone Encounter (Signed)
Called and left message for return call back

## 2013-09-27 ENCOUNTER — Ambulatory Visit (INDEPENDENT_AMBULATORY_CARE_PROVIDER_SITE_OTHER): Payer: PRIVATE HEALTH INSURANCE

## 2013-09-27 DIAGNOSIS — R4182 Altered mental status, unspecified: Secondary | ICD-10-CM

## 2013-09-27 NOTE — Procedures (Signed)
Darin Hickman is a 78 year old gentleman with a history of Parkinson's disease, dementia, and a gait disorder. The patient has had a waxing and waning mental status issue over the last one month. The patient is being evaluated for this altered mental status.  This is a routine EEG. No skull defects are noted. Medications include Sinemet, Dulcolax, Depakote, Colace, hydrocodone, Robaxin, Protonix, and Bactrim.  EEG classification: Dysrhythmia grade 1 generalized  Description of the recording: The background rhythms of this recording consists of a fairly well modulated medium amplitude rhythm of 7 Hz that is reactive to eye opening and closure. As the record progresses, the patient appears to remain in the waking state. Hyperventilation and photic stimulation were not performed. Throughout the recording, the background rhythms remain in the theta frequency range. At no time during the recording does there appear to be evidence of spike or spike wave discharges or evidence of focal slowing. EKG monitor shows no evidence of cardiac rhythm abnormalities with a heart rate of 84.  Impression: This is an abnormal EEG recording secondary to diffuse background theta frequency slowing. This finding is nonspecific, and may be seen with any mild metabolic or toxic encephalopathy, or any dementing illness. No epileptiform discharges were seen.

## 2013-09-27 NOTE — Telephone Encounter (Signed)
I called the nurse, Ms. Robbins, and one over the orders concerning this patient from yesterday to stop the amantadine.  I called the wife, EEG study shows mild diffuse slowing consistent with a mild toxic or metabolic encephalopathy, or any dementing illness. No epileptiform discharges were seen.

## 2013-09-28 LAB — COMPREHENSIVE METABOLIC PANEL
AST: 8 IU/L (ref 0–40)
Albumin/Globulin Ratio: 1.6 (ref 1.1–2.5)
Albumin: 4 g/dL (ref 3.5–4.8)
Alkaline Phosphatase: 92 IU/L (ref 39–117)
BUN/Creatinine Ratio: 15 (ref 10–22)
BUN: 17 mg/dL (ref 8–27)
CO2: 24 mmol/L (ref 18–29)
Creatinine, Ser: 1.1 mg/dL (ref 0.76–1.27)
GFR calc Af Amer: 75 mL/min/{1.73_m2} (ref >59)
Globulin, Total: 2.5 g/dL (ref 1.5–4.5)
Sodium: 145 mmol/L — ABNORMAL HIGH (ref 134–144)
Total Bilirubin: 0.2 mg/dL (ref 0.0–1.2)

## 2013-09-28 LAB — VALPROIC ACID LEVEL: Valproic Acid Lvl: 53 ug/mL (ref 50–100)

## 2013-09-28 LAB — AMMONIA: Ammonia: 98 ug/dL (ref 27–102)

## 2013-09-29 ENCOUNTER — Telehealth: Payer: Self-pay | Admitting: Neurology

## 2013-09-29 DIAGNOSIS — R410 Disorientation, unspecified: Secondary | ICD-10-CM

## 2013-09-29 NOTE — Telephone Encounter (Signed)
I called the wife. The blood work is ok. The patient is more confused and hypophonic. I will get a CT of the head.

## 2013-10-11 ENCOUNTER — Telehealth: Payer: Self-pay | Admitting: Neurology

## 2013-10-11 ENCOUNTER — Other Ambulatory Visit: Payer: Self-pay

## 2013-10-11 MED ORDER — BENZTROPINE MESYLATE 0.5 MG PO TABS: 0.5000 mg | ORAL_TABLET | Freq: Every day | ORAL | Status: DC

## 2013-10-11 NOTE — Telephone Encounter (Signed)
I talked with the nurse. The patient is having increased tremors. The patient is on Depakote, and this may not be helping. I will try low-dose Cogentin, but the patient has dementia, and this may significantly worsen confusion. They are to stop the Cogentin neatly of confusion worsens. The patient will go on 0.5 mg daily.

## 2014-02-12 ENCOUNTER — Encounter: Payer: Self-pay | Admitting: Neurology

## 2014-02-12 ENCOUNTER — Ambulatory Visit (INDEPENDENT_AMBULATORY_CARE_PROVIDER_SITE_OTHER): Payer: PRIVATE HEALTH INSURANCE | Admitting: Neurology

## 2014-02-12 VITALS — BP 97/56 | HR 84

## 2014-02-12 DIAGNOSIS — G20A1 Parkinson's disease without dyskinesia, without mention of fluctuations: Secondary | ICD-10-CM

## 2014-02-12 DIAGNOSIS — R413 Other amnesia: Secondary | ICD-10-CM

## 2014-02-12 DIAGNOSIS — G20 Parkinson's disease: Secondary | ICD-10-CM

## 2014-02-12 DIAGNOSIS — R269 Unspecified abnormalities of gait and mobility: Secondary | ICD-10-CM

## 2014-02-12 NOTE — Progress Notes (Signed)
Reason for visit: Parkinson's disease  Darin Hickman is an 79 y.o. male  History of present illness:  Darin Hickman is a 79 year old right-handed white male with a history of Parkinson's disease, dementia, and a gait disorder, and a REM sleep disorder. The patient is residing in an extended care facility. The patient walks short distances with a walker and with a physical therapist with a PT belt around him. The patient has not had any recent falls. The patient is eating and drinking well, and wife does not know of any problems with choking. The patient is on Sinemet taking the 25/250 tablets, one tablet in the morning (9AM), and one half tablet at 1 PM, 5 PM, and 9 PM. The patient is on clonazepam 0.25 mg at night if needed. The patient was on Cogentin, but this will be discontinued. The patient takes Ativan 0.5 mg in the morning. The patient does dream at night, and he will act out his dreams. The patient continues to have some progression of his memory issues.  Past Medical History  Diagnosis Date  . Full incontinence of feces   . Unspecified urinary incontinence   . Memory loss   . Abnormality of gait   . Paralysis agitans   . Memory loss   . Memory deficit 07/25/2013  . Diplopia   . DVT (deep venous thrombosis)     Left thigh  . Dyslipidemia   . REM sleep behavior disorder     Past Surgical History  Procedure Laterality Date  . Cholecystectomy      Family History  Problem Relation Age of Onset  . Cancer Mother   . Heart disease Father   . Diabetes Sister     Social history:  reports that he has never smoked. He has never used smokeless tobacco. He reports that he does not drink alcohol or use illicit drugs.   No Known Allergies  Medications:  Current Outpatient Prescriptions on File Prior to Visit  Medication Sig Dispense Refill  . acetaminophen (TYLENOL) 325 MG tablet Take 650 mg by mouth every 6 (six) hours as needed for pain.      Marland Kitchen acetic acid 0.25 % irrigation  Irrigate with 0.25 application as directed as needed.      . beta carotene w/minerals (OCUVITE) tablet Take 1 tablet by mouth daily.      . bisacodyl (DULCOLAX) 10 MG suppository Place 10 mg rectally every 3 (three) days.      . carbidopa-levodopa (SINEMET IR) 25-250 MG per tablet 1 tablet at 9 AM, one half tablet at 1 PM, 5 PM, and 9 PM, brand-name medicine is medically necessary      . clonazePAM (KLONOPIN) 0.5 MG tablet Take 0.25 mg by mouth at bedtime.       . Cranberry 250 MG CAPS Take 250 mg by mouth daily.      Marland Kitchen docusate sodium (COLACE) 100 MG capsule Take 100 mg by mouth 2 (two) times daily.      . ergocalciferol (VITAMIN D2) 50000 UNITS capsule Take 50,000 Units by mouth every 30 (thirty) days.      . flavoxATE (URISPAS) 100 MG tablet Take 100 mg by mouth 2 (two) times daily as needed.      Marland Kitchen HYDROcodone-acetaminophen (NORCO/VICODIN) 5-325 MG per tablet Take 5-325 tablets by mouth every 6 (six) hours as needed.       . magnesium hydroxide (MILK OF MAGNESIA) 400 MG/5ML suspension Take 30 mLs by mouth daily.       Marland Kitchen  methocarbamol (ROBAXIN) 500 MG tablet Take 500 mg by mouth 2 (two) times daily as needed.      . Nystatin (NYAMYC) 100000 UNIT/GM POWD Apply 100,000 g topically as needed.      . pantoprazole (PROTONIX) 40 MG tablet Take 40 mg by mouth daily.      . polyethylene glycol (MIRALAX / GLYCOLAX) packet Take 17 g by mouth daily.      Marland Kitchen. sulfamethoxazole-trimethoprim (BACTRIM,SEPTRA) 400-80 MG per tablet Take 1 tablet by mouth daily.        No current facility-administered medications on file prior to visit.    ROS:  Out of a complete 14 system review of symptoms, the patient complains only of the following symptoms, and all other reviewed systems are negative.  Neck pain, neck stiffness, hearing loss Difficulty swallowing Eye redness, double vision, loss of vision Constipation Insomnia, daytime sleepiness Memory loss, headache, speech difficulty Walking  difficulties Confusion, anxiety  Blood pressure 97/56, pulse 84, weight 0 lb (0 kg).  Physical Exam  General: The patient is alert and cooperative at the time of the examination.  Skin: No significant peripheral edema is noted.   Neurologic Exam  Mental status: The Mini-Mental status examination done today shows a total score of 11/30.  Cranial nerves: Facial symmetry is present. Speech is dysphonic, dysarthric. The patient has confused speech. Extraocular movements are full. Visual fields are full. Masking of the face is seen.  Motor: The patient has good strength in all 4 extremities.  Sensory examination: Soft touch sensation is symmetric on the face, arms, and legs.  Coordination: The patient has severe apraxia with the arms and legs with finger-nose-finger and heel-to-shin bilaterally.  Gait and station: The patient requires assistance with standing. Once up, the patient leans backwards. With assistance, he can walk short distances with a short shuffling gait. The patient has difficulty with turns. The patient is unable to stand upright without assistance. The patient will fall backwards.  Reflexes: Deep tendon reflexes are symmetric.   Assessment/Plan:  One. Parkinson's disease  2. Dementia  3. Gait disorder  4. REM sleep disorder  The patient has not had any further episodes of flailing arms, or increased confusion. The patient was set up for a CT of the brain, this was never done. An EEG study showed mild diffuse slowing consistent with a metabolic encephalopathy. The patient continues to progress with his dementia. The Cogentin will be discontinued. The patient will be kept on his current dose of Sinemet. The patient will followup through this office in 4 months.  Marlan Palau. Keith Willis MD 02/12/2014 7:31 PM  [facility] [address] [place], [place] [id number]  Phone [phone] Fax [phone]

## 2014-02-12 NOTE — Patient Instructions (Signed)

## 2014-02-13 ENCOUNTER — Encounter: Payer: Self-pay | Admitting: Neurology

## 2014-04-11 NOTE — Telephone Encounter (Signed)
Closing encounter

## 2014-08-02 ENCOUNTER — Ambulatory Visit (INDEPENDENT_AMBULATORY_CARE_PROVIDER_SITE_OTHER): Payer: PRIVATE HEALTH INSURANCE | Admitting: Neurology

## 2014-08-02 ENCOUNTER — Encounter: Payer: Self-pay | Admitting: Neurology

## 2014-08-02 VITALS — BP 110/60 | HR 62

## 2014-08-02 DIAGNOSIS — R269 Unspecified abnormalities of gait and mobility: Secondary | ICD-10-CM

## 2014-08-02 DIAGNOSIS — R413 Other amnesia: Secondary | ICD-10-CM

## 2014-08-02 DIAGNOSIS — G20A1 Parkinson's disease without dyskinesia, without mention of fluctuations: Secondary | ICD-10-CM

## 2014-08-02 DIAGNOSIS — G20 Parkinson's disease: Secondary | ICD-10-CM

## 2014-08-02 NOTE — Progress Notes (Signed)
Reason for visit: Parkinson's disease  Darin Hickman is an 79 y.o. male  History of present illness:  Darin Hickman is a 79 year old right-handed white male with a history of Parkinson's disease and dementia. The patient is essentially nonambulatory. He will walk short distances with maximum assistance using a walker. He has a very definite tendency to lean backwards. He requires assistance with transfers. He is on Sinemet taking the 25/200 mg tablet, one tablet during in the morning, and one half tablet at 1 PM, 5 PM, and 9 PM. The patient has had very little change in his physical abilities since last seen. The patient can mobilize some with his legs while in a wheelchair. The patient has tremors of both upper extremities. In general, he is eating and swallowing well, but he has very occasional problems choking on medications. The patient will take naps during the day, and he may not be able to sleep well at night. The patient may have good days and bad days with his cognitive functioning. He returns to the office today for an evaluation. He resides in an extended care facility, [facility].   Past Medical History  Diagnosis Date  . Full incontinence of feces   . Unspecified urinary incontinence   . Memory loss   . Abnormality of gait   . Paralysis agitans   . Memory loss   . Memory deficit 07/25/2013  . Diplopia   . DVT (deep venous thrombosis)     Left thigh  . Dyslipidemia   . REM sleep behavior disorder     Past Surgical History  Procedure Laterality Date  . Cholecystectomy      Family History  Problem Relation Age of Onset  . Cancer Mother   . Heart disease Father   . Diabetes Sister     Social history:  reports that he has never smoked. He has never used smokeless tobacco. He reports that he does not drink alcohol or use illicit drugs.   No Known Allergies  Medications:  Current Outpatient Prescriptions on File Prior to Visit  Medication Sig Dispense Refill    . acetaminophen (TYLENOL) 325 MG tablet Take 650 mg by mouth every 6 (six) hours as needed for pain.      Marland Kitchen acetic acid 0.25 % irrigation Irrigate with 0.25 application as directed as needed.      . beta carotene w/minerals (OCUVITE) tablet Take 1 tablet by mouth daily.      . bisacodyl (DULCOLAX) 10 MG suppository Place 10 mg rectally every 3 (three) days.      . carbidopa-levodopa (SINEMET IR) 25-250 MG per tablet 1 tablet at 9 AM, one half tablet at 1 PM, 5 PM, and 9 PM, brand-name medicine is medically necessary      . clonazePAM (KLONOPIN) 0.5 MG tablet Take 0.25 mg by mouth at bedtime.       . Cranberry 250 MG CAPS Take 250 mg by mouth daily.      . divalproex (DEPAKOTE SPRINKLE) 125 MG capsule 2 tablets in the morning, 4 tablets in the evening      . docusate sodium (COLACE) 100 MG capsule Take 100 mg by mouth 2 (two) times daily.      . ergocalciferol (VITAMIN D2) 50000 UNITS capsule Take 50,000 Units by mouth every 30 (thirty) days.      . flavoxATE (URISPAS) 100 MG tablet Take 100 mg by mouth 2 (two) times daily as needed.      Marland Kitchen  HYDROcodone-acetaminophen (NORCO/VICODIN) 5-325 MG per tablet Take 5-325 tablets by mouth every 6 (six) hours as needed.       . lidocaine (XYLOCAINE) 1 % (with preservative) injection 1 mL by Other route once.      Marland Kitchen LORazepam (ATIVAN) 0.5 MG tablet Take 0.5 mg by mouth as needed.      . methocarbamol (ROBAXIN) 500 MG tablet Take 500 mg by mouth 2 (two) times daily as needed.      . Nystatin (NYAMYC) 100000 UNIT/GM POWD Apply 100,000 g topically as needed.      . pantoprazole (PROTONIX) 40 MG tablet Take 40 mg by mouth daily.      . polyethylene glycol (MIRALAX / GLYCOLAX) packet Take 17 g by mouth daily.      Marland Kitchen sulfamethoxazole-trimethoprim (BACTRIM,SEPTRA) 400-80 MG per tablet Take 1 tablet by mouth daily.        No current facility-administered medications on file prior to visit.    ROS:  Out of a complete 14 system review of symptoms, the patient  complains only of the following symptoms, and all other reviewed systems are negative.  Hearing loss Eye itching, eye redness, double vision, blurred vision Incontinence of bowels Frequent waking, daytime sleepiness Incontinence of bladder Walking difficulties Memory loss, headache, speech difficulty, tremors Agitation, confusion, depression  Blood pressure 110/60, pulse 62, weight 0 lb (0 kg).  Physical Exam  General: The patient is alert and cooperative at the time of the examination.  Skin: No significant peripheral edema is noted.   Neurologic Exam  Mental status: The Mini-Mental status examination done today shows a total score of 17/30.   Cranial nerves: Facial symmetry is present. Speech is normal, no aphasia or dysarthria is noted. Speech is hypophonic. Extraocular movements are full, with exception that there is some restriction of superior gaze . Visual fields are full. masking of the face is seen.   Motor: The patient has good strength in all 4 extremities.  Sensory examination: Soft touch sensation is symmetric on the face, arms, and legs.   Coordination: The patient has good finger-nose-finger and heel-to-shin bilaterally. The patient has apraxia with the use of the extremities, however. The patient has prominent resting tremors of both upper extremities.   Gait and station: The patient requires assistance with standing. Once up, he has a tendency to lean backwards, cannot effectively ambulate.   Reflexes: Deep tendon reflexes are symmetric.   Assessment/Plan:  One. Parkinson's disease  2. Gait disorder  3. Dementia  The patient appears to be relatively stable from the prior examination, his cognitive functioning has improved. The patient will be kept on his current dose of the Sinemet, and he will try to remain as active as possible. The patient can mobilize only while using a wheelchair, using his legs to move the chair. If the swallowing issues, or  significant, the patient can be converted to an oral disintegrating tablet of the Sinemet.   Marlan Palau MD 08/04/2014 3:10 PM  [facility] [address] [place], [place] [address]  Phone [phone] Fax [phone]

## 2014-08-02 NOTE — Patient Instructions (Signed)
Parkinson Disease Parkinson disease is a disorder of the central nervous system, which includes the brain and spinal cord. A person with this disease slowly loses the ability to completely control body movements. Within the brain, there is a group of nerve cells (basal ganglia) that help control movement. The basal ganglia are damaged and do not work properly in a person with Parkinson disease. In addition, the basal ganglia produce and use a brain chemical called dopamine. The dopamine chemical sends messages to other parts of the body to control and coordinate body movements. Dopamine levels are low in a person with Parkinson disease. If the dopamine levels are low, then the body does not receive the correct messages it needs to move normally.  CAUSES  The exact reason why the basal ganglia get damaged is not known. Some medical researchers have thought that infection, genes, environment, and certain medicines may contribute to the cause.  SYMPTOMS   An early symptom of Parkinson disease is often an uncontrolled shaking (tremor) of the hands. The tremor will often disappear when the affected hand is consciously used.  As the disease progresses, walking, talking, getting out of a chair, and new movements become more difficult.  Muscles get stiff and movements become slower.  Balance and coordination become harder.  Depression, trouble swallowing, urinary problems, constipation, and sleep problems can occur.  Later in the disease, memory and thought processes may deteriorate. DIAGNOSIS  There are no specific tests to diagnose Parkinson disease. You may be referred to a neurologist for evaluation. Your caregiver will ask about your medical history, symptoms, and perform a physical exam. Blood tests and imaging tests of your brain may be performed to rule out other diseases. The imaging tests may include an MRI or a CT scan. TREATMENT  The goal of treatment is to relieve symptoms. Medicines may be  prescribed once the symptoms become troublesome. Medicine will not stop the progression of the disease, but medicine can make movement and balance better and help control tremors. Speech and occupational therapy may also be prescribed. Sometimes, surgical treatment of the brain can be done in young people. HOME CARE INSTRUCTIONS  Get regular exercise and rest periods during the day to help prevent exhaustion and depression.  If getting dressed becomes difficult, replace buttons and zippers with Velcro and elastic on your clothing.  Take all medicine as directed by your caregiver.  Install grab bars or railings in your home to prevent falls.  Go to speech or occupational therapy as directed.  Keep all follow-up visits as directed by your caregiver. SEEK MEDICAL CARE IF:  Your symptoms are not controlled with your medicine.  You fall.  You have trouble swallowing or choke on your food. MAKE SURE YOU:  Understand these instructions.  Will watch your condition.  Will get help right away if you are not doing well or get worse. Document Released: 11/19/2000 Document Revised: 03/19/2013 Document Reviewed: 12/22/2011 [employer] Patient Information 2015 [employer]. This information is not intended to replace advice given to you by your health care provider. Make sure you discuss any questions you have with your health care provider.  

## 2014-09-20 ENCOUNTER — Other Ambulatory Visit: Payer: Self-pay

## 2015-01-02 ENCOUNTER — Encounter: Payer: Self-pay | Admitting: Neurology

## 2015-01-02 ENCOUNTER — Ambulatory Visit (INDEPENDENT_AMBULATORY_CARE_PROVIDER_SITE_OTHER): Payer: 59 | Admitting: Neurology

## 2015-01-02 VITALS — BP 107/57 | HR 75 | Ht 71.0 in | Wt 150.0 lb

## 2015-01-02 DIAGNOSIS — G20 Parkinson's disease: Secondary | ICD-10-CM

## 2015-01-02 DIAGNOSIS — R413 Other amnesia: Secondary | ICD-10-CM

## 2015-01-02 DIAGNOSIS — R269 Unspecified abnormalities of gait and mobility: Secondary | ICD-10-CM

## 2015-01-02 DIAGNOSIS — G20A1 Parkinson's disease without dyskinesia, without mention of fluctuations: Secondary | ICD-10-CM

## 2015-01-02 MED ORDER — CARBIDOPA-LEVODOPA 25-250 MG PO TABS: ORAL_TABLET | ORAL | Status: DC

## 2015-01-02 NOTE — Patient Instructions (Signed)
Parkinson Disease Parkinson disease is a disorder of the central nervous system, which includes the brain and spinal cord. A person with this disease slowly loses the ability to completely control body movements. Within the brain, there is a group of nerve cells (basal ganglia) that help control movement. The basal ganglia are damaged and do not work properly in a person with Parkinson disease. In addition, the basal ganglia produce and use a brain chemical called dopamine. The dopamine chemical sends messages to other parts of the body to control and coordinate body movements. Dopamine levels are low in a person with Parkinson disease. If the dopamine levels are low, then the body does not receive the correct messages it needs to move normally.  CAUSES  The exact reason why the basal ganglia get damaged is not known. Some medical researchers have thought that infection, genes, environment, and certain medicines may contribute to the cause.  SYMPTOMS   An early symptom of Parkinson disease is often an uncontrolled shaking (tremor) of the hands. The tremor will often disappear when the affected hand is consciously used.  As the disease progresses, walking, talking, getting out of a chair, and new movements become more difficult.  Muscles get stiff and movements become slower.  Balance and coordination become harder.  Depression, trouble swallowing, urinary problems, constipation, and sleep problems can occur.  Later in the disease, memory and thought processes may deteriorate. DIAGNOSIS  There are no specific tests to diagnose Parkinson disease. You may be referred to a neurologist for evaluation. Your caregiver will ask about your medical history, symptoms, and perform a physical exam. Blood tests and imaging tests of your brain may be performed to rule out other diseases. The imaging tests may include an MRI or a CT scan. TREATMENT  The goal of treatment is to relieve symptoms. Medicines may be  prescribed once the symptoms become troublesome. Medicine will not stop the progression of the disease, but medicine can make movement and balance better and help control tremors. Speech and occupational therapy may also be prescribed. Sometimes, surgical treatment of the brain can be done in young people. HOME CARE INSTRUCTIONS  Get regular exercise and rest periods during the day to help prevent exhaustion and depression.  If getting dressed becomes difficult, replace buttons and zippers with Velcro and elastic on your clothing.  Take all medicine as directed by your caregiver.  Install grab bars or railings in your home to prevent falls.  Go to speech or occupational therapy as directed.  Keep all follow-up visits as directed by your caregiver. SEEK MEDICAL CARE IF:  Your symptoms are not controlled with your medicine.  You fall.  You have trouble swallowing or choke on your food. MAKE SURE YOU:  Understand these instructions.  Will watch your condition.  Will get help right away if you are not doing well or get worse. Document Released: 11/19/2000 Document Revised: 03/19/2013 Document Reviewed: 12/22/2011 [employer] Patient Information 2015 [employer]. This information is not intended to replace advice given to you by your health care provider. Make sure you discuss any questions you have with your health care provider.  

## 2015-01-02 NOTE — Progress Notes (Signed)
Reason for visit: Parkinson's disease  Darin Hickman is an 80 y.o. male  History of present illness:  Darin Hickman is a 80 year old right-handed white male with Parkinson's disease, dementia, and a gait disorder. The patient has been relatively stable with his memory issues. The patient indicates that he is walking some with a walker with assistance, he may do this up to 3 times a week. The patient has not had any falls since last seen. He denies any significant issues with swallowing. He reports some difficulty with blurring of vision. He continues to have tremors of the upper extremities. Overall, there has not been a big change in his functional level since last seen. He denies any significant issues with hallucinations at this point. He returns to this office for an evaluation.  Past Medical History  Diagnosis Date  . Full incontinence of feces   . Unspecified urinary incontinence   . Memory loss   . Abnormality of gait   . Paralysis agitans   . Memory loss   . Memory deficit 07/25/2013  . Diplopia   . DVT (deep venous thrombosis)     Left thigh  . Dyslipidemia   . REM sleep behavior disorder     Past Surgical History  Procedure Laterality Date  . Cholecystectomy      Family History  Problem Relation Age of Onset  . Cancer Mother   . Heart disease Father   . Diabetes Sister     Social history:  reports that he has never smoked. He has never used smokeless tobacco. He reports that he does not drink alcohol or use illicit drugs.    Allergies  Allergen Reactions  . Benadryl [Diphenhydramine Hcl (Sleep)] Other (See Comments)    Hallucinations    Medications:  Current Outpatient Prescriptions on File Prior to Visit  Medication Sig Dispense Refill  . acetaminophen (TYLENOL) 325 MG tablet Take 650 mg by mouth every 6 (six) hours as needed for pain.    Marland Kitchen acetic acid 0.25 % irrigation Irrigate with 0.25 application as directed as needed.    . beta carotene w/minerals  (OCUVITE) tablet Take 1 tablet by mouth daily.    . bisacodyl (DULCOLAX) 10 MG suppository Place 10 mg rectally every 3 (three) days.    . clonazePAM (KLONOPIN) 0.5 MG tablet Take 0.25 mg by mouth at bedtime.     . Cranberry 250 MG CAPS Take 250 mg by mouth daily.    . divalproex (DEPAKOTE SPRINKLE) 125 MG capsule 2 tablets in the morning, 4 tablets in the evening    . docusate sodium (COLACE) 100 MG capsule Take 100 mg by mouth 2 (two) times daily.    . ergocalciferol (VITAMIN D2) 50000 UNITS capsule Take 50,000 Units by mouth every 30 (thirty) days.    . flavoxATE (URISPAS) 100 MG tablet Take 100 mg by mouth 2 (two) times daily as needed.    . lidocaine (XYLOCAINE) 1 % (with preservative) injection 1 mL by Other route once.    Marland Kitchen LORazepam (ATIVAN) 0.5 MG tablet Take 0.5 mg by mouth as needed.    . methocarbamol (ROBAXIN) 500 MG tablet Take 500 mg by mouth 2 (two) times daily as needed.    . Nystatin (NYAMYC) 100000 UNIT/GM POWD Apply 100,000 g topically as needed.    . pantoprazole (PROTONIX) 40 MG tablet Take 40 mg by mouth daily.    . polyethylene glycol (MIRALAX / GLYCOLAX) packet Take 17 g by mouth daily.    Marland Kitchen  HYDROcodone-acetaminophen (NORCO/VICODIN) 5-325 MG per tablet Take 5-325 tablets by mouth every 6 (six) hours as needed.     . sulfamethoxazole-trimethoprim (BACTRIM,SEPTRA) 400-80 MG per tablet Take 1 tablet by mouth daily.      No current facility-administered medications on file prior to visit.    ROS:  Out of a complete 14 system review of symptoms, the patient complains only of the following symptoms, and all other reviewed systems are negative.  Hearing loss Eye redness, blurred vision Gait disorder Memory disorder  Blood pressure 107/57, pulse 75, height 5\' 11"  (1.803 m), weight 150 lb (68.04 kg).  Physical Exam  General: The patient is alert and cooperative at the time of the examination.  Skin: No significant peripheral edema is noted.   Neurologic  Exam  Mental status: The Mini-Mental Status Examination done today shows a total score 21/30.  Cranial nerves: Facial symmetry is present. Speech is dysphonic, whispery. Extraocular movements are full, with exception that there is some restricted superior gaze.Jill Alexanders. Visual fields are full. Masking of the face is seen.  Motor: The patient has good strength in all 4 extremities.  Sensory examination: Soft touch sensation is symmetric on the face, arms, and legs.  Coordination: The patient has good finger-nose-finger and heel-to-shin bilaterally. Resting tremors are noted with both upper extremities.  Gait and station: The patient requires assistance with standing. Once up, he is able to ambulate a short distance with assistance, he has short shuffling steps, some difficulty with turns. He has a tendency to lean backwards.  Reflexes: Deep tendon reflexes are symmetric.   Assessment/Plan:  1. Parkinson's disease  2. Gait disorder  3. Dementia  The patient appears to actually have improved with his cognitive testing today. The patient is remaining somewhat mobile, we will increase the Sinemet slightly taking one full tablet at 9 AM, and at 5 PM. The half tablet of the 25/250 mg Sinemet will be taken at 1 PM and 9 PM. He will follow-up in about 5 months. We discussed the possibility of a medications for memory, the wife indicates that he is taking too much medication currently.  Marlan Palau. Keith Monserrate Blaschke MD 01/02/2015 8:20 PM  [facility] [address] [place], [place] [id number]  Phone [phone] Fax [phone]

## 2015-06-02 ENCOUNTER — Other Ambulatory Visit: Payer: Self-pay

## 2015-06-27 ENCOUNTER — Encounter: Payer: Self-pay | Admitting: Neurology

## 2015-06-27 ENCOUNTER — Telehealth: Payer: Self-pay | Admitting: Neurology

## 2015-06-27 ENCOUNTER — Ambulatory Visit (INDEPENDENT_AMBULATORY_CARE_PROVIDER_SITE_OTHER): Payer: Medicare Other | Admitting: Neurology

## 2015-06-27 ENCOUNTER — Ambulatory Visit
Admission: RE | Admit: 2015-06-27 | Discharge: 2015-06-27 | Disposition: A | Discharge status: Home / Self Care | Payer: Medicare Other | Source: Ambulatory Visit | Attending: Neurology | Admitting: Neurology

## 2015-06-27 VITALS — BP 118/62 | HR 80 | Ht 71.0 in | Wt 149.0 lb

## 2015-06-27 DIAGNOSIS — M47812 Spondylosis without myelopathy or radiculopathy, cervical region: Secondary | ICD-10-CM

## 2015-06-27 DIAGNOSIS — R413 Other amnesia: Secondary | ICD-10-CM

## 2015-06-27 DIAGNOSIS — G20 Parkinson's disease: Secondary | ICD-10-CM | POA: Diagnosis not present

## 2015-06-27 DIAGNOSIS — R269 Unspecified abnormalities of gait and mobility: Secondary | ICD-10-CM

## 2015-06-27 DIAGNOSIS — G20A1 Parkinson's disease without dyskinesia, without mention of fluctuations: Secondary | ICD-10-CM

## 2015-06-27 NOTE — Patient Instructions (Addendum)
   We will not change medications for Parkinson's disease today. The neck pain and arm tingling may be related to arthritis in the neck, we will check an x-ray of the neck. If the symptoms worsen, please let us know, we may need to pursue further investigation of this issue.  Parkinson Disease Parkinson disease is a disorder of the central nervous system, which includes the brain and spinal cord. A person with this disease slowly loses the ability to completely control body movements. Within the brain, there is a group of nerve cells (basal ganglia) that help control movement. The basal ganglia are damaged and do not work properly in a person with Parkinson disease. In addition, the basal ganglia produce and use a brain chemical called dopamine. The dopamine chemical sends messages to other parts of the body to control and coordinate body movements. Dopamine levels are low in a person with Parkinson disease. If the dopamine levels are low, then the body does not receive the correct messages it needs to move normally.  CAUSES  The exact reason why the basal ganglia get damaged is not known. Some medical researchers have thought that infection, genes, environment, and certain medicines may contribute to the cause.  SYMPTOMS   An early symptom of Parkinson disease is often an uncontrolled shaking (tremor) of the hands. The tremor will often disappear when the affected hand is consciously used.  As the disease progresses, walking, talking, getting out of a chair, and new movements become more difficult.  Muscles get stiff and movements become slower.  Balance and coordination become harder.  Depression, trouble swallowing, urinary problems, constipation, and sleep problems can occur.  Later in the disease, memory and thought processes may deteriorate. DIAGNOSIS  There are no specific tests to diagnose Parkinson disease. You may be referred to a neurologist for evaluation. Your caregiver will ask  about your medical history, symptoms, and perform a physical exam. Blood tests and imaging tests of your brain may be performed to rule out other diseases. The imaging tests may include an MRI or a CT scan. TREATMENT  The goal of treatment is to relieve symptoms. Medicines may be prescribed once the symptoms become troublesome. Medicine will not stop the progression of the disease, but medicine can make movement and balance better and help control tremors. Speech and occupational therapy may also be prescribed. Sometimes, surgical treatment of the brain can be done in young people. HOME CARE INSTRUCTIONS  Get regular exercise and rest periods during the day to help prevent exhaustion and depression.  If getting dressed becomes difficult, replace buttons and zippers with Velcro and elastic on your clothing.  Take all medicine as directed by your caregiver.  Install grab bars or railings in your home to prevent falls.  Go to speech or occupational therapy as directed.  Keep all follow-up visits as directed by your caregiver. SEEK MEDICAL CARE IF:  Your symptoms are not controlled with your medicine.  You fall.  You have trouble swallowing or choke on your food. MAKE SURE YOU:  Understand these instructions.  Will watch your condition.  Will get help right away if you are not doing well or get worse. Document Released: 11/19/2000 Document Revised: 03/19/2013 Document Reviewed: 12/22/2011 [facility] Patient Information 2015 [place], [place]. This information is not intended to replace advice given to you by your health care provider. Make sure you discuss any questions you have with your health care provider.

## 2015-06-27 NOTE — Progress Notes (Signed)
Reason for visit: Parkinson's disease  Darin Hickman is an 80 y.o. male  History of present illness:  Mr. Darin Hickman is a 80 year old right-handed white male with a history of Parkinson's disease associated with a severe gait disorder. The patient has had a cognitive decline as well. Since last seen, the patient has had new complaints of neck discomfort, and paresthesias going down both arms, left greater than right. The patient has symptoms that come and go. Occupational therapy is involved, they are using warm compresses, the patient is on Robaxin to take if needed. He denies any weakness of the extremities, he denies any paresthesias of the legs. He has not had any significant change in bowel or bladder control, or any change in his walking. The patient requires assistance with walking, however. He will use a walker, and someone needs to be behind him with a belt, the patient has a definite tendency to lean backwards. He has fallen out of his wheelchair on 2 occasions. He has not sustained any significant injury. He does not know whether or not he has had cervical spine x-rays. The patient may have dyskinesias that are prominent when he is upset or nervous about something. The patient has some daytime drowsiness. He continues to have some issues with sleep, with vivid dreams and he is felt to have a REM sleep disorder. He has been evaluated for chest pain, he is felt to have reflux problems, he has been placed on famotidine. He also reports some issues with hearing, primarily with the right ear. He returns to this office for an evaluation.  Past Medical History  Diagnosis Date  . Full incontinence of feces   . Unspecified urinary incontinence   . Memory loss   . Abnormality of gait   . Paralysis agitans   . Memory loss   . Memory deficit 07/25/2013  . Diplopia   . DVT (deep venous thrombosis)     Left thigh  . Dyslipidemia   . REM sleep behavior disorder     Past Surgical History    Procedure Laterality Date  . Cholecystectomy      Family History  Problem Relation Age of Onset  . Cancer Mother   . Heart disease Father   . Diabetes Sister     Social history:  reports that he has never smoked. He has never used smokeless tobacco. He reports that he does not drink alcohol or use illicit drugs.    Allergies  Allergen Reactions  . Benadryl [Diphenhydramine Hcl (Sleep)] Other (See Comments)    Hallucinations    Medications:  Prior to Admission medications   Medication Sig Start Date End Date Taking? Authorizing Provider  acetaminophen (TYLENOL) 325 MG tablet Take 650 mg by mouth every 6 (six) hours as needed for pain.    Historical Provider, MD  acetic acid 0.25 % irrigation Irrigate with 0.25 application as directed as needed. 06/05/13   Historical Provider, MD  beta carotene w/minerals (OCUVITE) tablet Take 1 tablet by mouth daily.    Historical Provider, MD  bisacodyl (DULCOLAX) 10 MG suppository Place 10 mg rectally every 3 (three) days.    Historical Provider, MD  carbidopa-levodopa (SINEMET IR) 25-250 MG per tablet 1 tablet at 9 AM and at 5 pm, one half tablet at 1 PM, and 9 PM, brand-name medicine is medically necessary 01/02/15   [name], MD  clonazePAM (KLONOPIN) 0.5 MG tablet Take 0.25 mg by mouth at bedtime.  06/27/13  Historical Provider, MD  Cranberry 250 MG CAPS Take 250 mg by mouth daily.    Historical Provider, MD  divalproex (DEPAKOTE SPRINKLE) 125 MG capsule 2 tablets in the morning, 4 tablets in the evening 07/25/13   [name], MD  docusate sodium (COLACE) 100 MG capsule Take 100 mg by mouth 2 (two) times daily.    Historical Provider, MD  ergocalciferol (VITAMIN D2) 50000 UNITS capsule Take 50,000 Units by mouth every 30 (thirty) days.    Historical Provider, MD  flavoxATE (URISPAS) 100 MG tablet Take 100 mg by mouth 2 (two) times daily as needed.    Historical Provider, MD  HYDROcodone-acetaminophen (NORCO/VICODIN) 5-325 MG per  tablet Take 5-325 tablets by mouth every 6 (six) hours as needed.  06/06/13   Historical Provider, MD  lidocaine (XYLOCAINE) 1 % (with preservative) injection 1 mL by Other route once. 01/15/14   Historical Provider, MD  LORazepam (ATIVAN) 0.5 MG tablet Take 0.5 mg by mouth as needed. 01/27/14   Historical Provider, MD  methocarbamol (ROBAXIN) 500 MG tablet Take 500 mg by mouth 2 (two) times daily as needed.    Historical Provider, MD  naphazoline-pheniramine (NAPHCON-A) 0.025-0.3 % ophthalmic solution 1 drop 4 (four) times daily as needed for irritation.    Historical Provider, MD  Nystatin (NYAMYC) 100000 UNIT/GM POWD Apply 100,000 g topically as needed. 06/20/13   Historical Provider, MD  pantoprazole (PROTONIX) 40 MG tablet Take 40 mg by mouth daily. 07/05/13   Historical Provider, MD  polyethylene glycol (MIRALAX / GLYCOLAX) packet Take 17 g by mouth daily.    Historical Provider, MD  sulfamethoxazole-trimethoprim (BACTRIM,SEPTRA) 400-80 MG per tablet Take 1 tablet by mouth daily.  07/24/13   Historical Provider, MD    ROS:  Out of a complete 14 system review of symptoms, the patient complains only of the following symptoms, and all other reviewed systems are negative.  Hearing loss Drooling Eye redness, blurred vision Chest pain Incontinence of the bladder Muscle spasms, walking difficulty, neck pain, neck stiffness Memory loss, speech difficulty, tremors Agitation, anxiety  Blood pressure 118/62, pulse 80, height 5\' 11"  (1.803 m), weight 149 lb (67.586 kg).  Physical Exam  General: The patient is alert and cooperative at the time of the examination.  Skin: No significant peripheral edema is noted.   Neurologic Exam  Mental status: The patient is alert and oriented x 2 at the time of the examination (not oriented to date). The Mini-Mental Status Examination done today shows a total score of 16/30. The patient is able to name 6 animals in 30 seconds.   Cranial nerves: Facial  symmetry is present. Speech is normal, no aphasia or dysarthria is noted. Extraocular movements are full. Visual fields are full.  Motor: The patient has good strength in all 4 extremities. Significant dyskinesias are seen involving the head and neck, shoulders and arms.  Sensory examination: Soft touch sensation is symmetric on the face, arms, and legs.  Coordination: The patient has good finger-nose-finger and heel-to-shin bilaterally. The patient has apraxia with the use of the extremities.  Gait and station: The patient requires assistance with standing. Once up, he leans backwards, unable to stand on his own. He is able to take some steps with assistance, he has difficulty with turns. Tandem gait was not attempted. Romberg is positive.  Reflexes: Deep tendon reflexes are symmetric.   Assessment/Plan:  1. Parkinson's disease  2. Memory disorder  3. Gait disorder  4. Neck pain, bilateral arm paresthesias  The patient likely has cervical spondylosis and may have nerve root impingement leading to symptoms down both arms. The patient will be set up for a cervical spine x-ray, he is getting ongoing therapy and medication such as Robaxin. The patient will contact our office if the symptoms worsen, we may consider MRI evaluation of the cervical spine. The patient could be a candidate for epidural steroid injections in the future. The patient has significant dyskinesias today, but the wife indicates that this is not a persistent finding, generally it comes on when the patient is upset about something. The Sinemet dosing will not be changed. In the past, the patient has not wished to go on medications for memory. He will follow-up in 4 months. The patient resides in an extended care facility, [facility].  Marlan Palau MD 06/27/2015 3:39 PM  [facility] [address] [place], [place] [address]  Phone [phone] Fax [phone]

## 2015-06-27 NOTE — Telephone Encounter (Signed)
I called the wife. The x-ray the cervical spine does show multilevel facet joint arthritis, some anterolisthesis of C3 on C4. The patient likely is getting nerve root impingement resulting in some symptoms down both arms. If this worsens, MRI of the cervical spine can be done. The patient could potentially benefit from epidural steroid injections in the future.   Cervical spine x-ray 06/27/2015:  FINDINGS: The cervical vertebral bodies are preserved in height. There is multilevel moderate disc space narrowing from C3 inferiorly. There is 3 mm of grade 1 anterolisthesis of C3 with respect to C4. There is multilevel facet joint hypertrophy. The spinous processes are intact. The odontoid is intact where visualized. There are calcifications in the region of the left carotid bulb.  IMPRESSION: There is multilevel degenerative disc and facet joint change. No acute fracture or dislocation is observed.

## 2015-11-03 ENCOUNTER — Encounter: Payer: Self-pay | Admitting: Neurology

## 2015-11-03 ENCOUNTER — Ambulatory Visit (INDEPENDENT_AMBULATORY_CARE_PROVIDER_SITE_OTHER): Payer: Medicare Other | Admitting: Neurology

## 2015-11-03 VITALS — BP 126/67 | HR 84 | Ht 71.0 in | Wt 144.0 lb

## 2015-11-03 DIAGNOSIS — G20 Parkinson's disease: Secondary | ICD-10-CM

## 2015-11-03 DIAGNOSIS — R269 Unspecified abnormalities of gait and mobility: Secondary | ICD-10-CM

## 2015-11-03 DIAGNOSIS — R413 Other amnesia: Secondary | ICD-10-CM

## 2015-11-03 NOTE — Patient Instructions (Signed)
Parkinson Disease Parkinson disease is a disorder of the central nervous system, which includes the brain and spinal cord. A person with this disease slowly loses the ability to completely control body movements. Within the brain, there is a group of nerve cells (basal ganglia) that help control movement. The basal ganglia are damaged and do not work properly in a person with Parkinson disease. In addition, the basal ganglia produce and use a brain chemical called dopamine. The dopamine chemical sends messages to other parts of the body to control and coordinate body movements. Dopamine levels are low in a person with Parkinson disease. If the dopamine levels are low, then the body does not receive the correct messages it needs to move normally.  CAUSES  The exact reason why the basal ganglia get damaged is not known. Some medical researchers have thought that infection, genes, environment, and certain medicines may contribute to the cause.  SYMPTOMS   An early symptom of Parkinson disease is often an uncontrolled shaking (tremor) of the hands. The tremor will often disappear when the affected hand is consciously used.  As the disease progresses, walking, talking, getting out of a chair, and new movements become more difficult.  Muscles get stiff and movements become slower.  Balance and coordination become harder.  Depression, trouble swallowing, urinary problems, constipation, and sleep problems can occur.  Later in the disease, memory and thought processes may deteriorate. DIAGNOSIS  There are no specific tests to diagnose Parkinson disease. You may be referred to a neurologist for evaluation. Your caregiver will ask about your medical history, symptoms, and perform a physical exam. Blood tests and imaging tests of your brain may be performed to rule out other diseases. The imaging tests may include an MRI or a CT scan. TREATMENT  The goal of treatment is to relieve symptoms. Medicines may be  prescribed once the symptoms become troublesome. Medicine will not stop the progression of the disease, but medicine can make movement and balance better and help control tremors. Speech and occupational therapy may also be prescribed. Sometimes, surgical treatment of the brain can be done in young people. HOME CARE INSTRUCTIONS  Get regular exercise and rest periods during the day to help prevent exhaustion and depression.  If getting dressed becomes difficult, replace buttons and zippers with Velcro and elastic on your clothing.  Take all medicine as directed by your caregiver.  Install grab bars or railings in your home to prevent falls.  Go to speech or occupational therapy as directed.  Keep all follow-up visits as directed by your caregiver. SEEK MEDICAL CARE IF:  Your symptoms are not controlled with your medicine.  You fall.  You have trouble swallowing or choke on your food. MAKE SURE YOU:  Understand these instructions.  Will watch your condition.  Will get help right away if you are not doing well or get worse.   This information is not intended to replace advice given to you by your health care provider. Make sure you discuss any questions you have with your health care provider.   Document Released: 11/19/2000 Document Revised: 03/19/2013 Document Reviewed: 12/22/2011 Elsevier Interactive Patient Education 2016 [employer]  

## 2015-11-03 NOTE — Progress Notes (Signed)
Reason for visit:  Parkinson's disease  Darin Hickman is an 80 y.o. male  History of present illness:  Darin Hickman Is a 80 year old right-handed white male with history of Parkinson disease, dementia, and a gait disorder. The patient has had ongoing issues with walking. He has a tendency to lean backwards, he has not had any falls since last seen. He resides in an extended care facility, he gets some exercise 3 times a week. He is able to walk up to 500 feet with assistance. He requires a therapist with a PT belt behind him. The patient has a dysphonic speech pattern, he denies any issues with swallowing, he does drool on occasion. He does have some tremors. In order to get up out of a chair or bed he requires assistance. He has had some left neck and shoulder discomfort and some discomfort down the left arm. In the past, he has gotten epidural injections in the neck. He does not recall whether or not this helped him. The patient is on Sinemet, he denies any difficulty with confusion or hallucinations. The memory remains a problem for him.   Past Medical History  Diagnosis Date  . Full incontinence of feces   . Unspecified urinary incontinence   . Memory loss   . Abnormality of gait   . Paralysis agitans (HCC)   . Memory loss   . Memory deficit 07/25/2013  . Diplopia   . DVT (deep venous thrombosis) (HCC)     Left thigh  . Dyslipidemia   . REM sleep behavior disorder     Past Surgical History  Procedure Laterality Date  . Cholecystectomy      Family History  Problem Relation Age of Onset  . Cancer Mother   . Heart disease Father   . Diabetes Sister     Social history:  reports that he has never smoked. He has never used smokeless tobacco. He reports that he does not drink alcohol or use illicit drugs.    Allergies  Allergen Reactions  . Benadryl [Diphenhydramine Hcl (Sleep)] Other (See Comments)    Hallucinations    Medications:  Prior to Admission medications     Medication Sig Start Date End Date Taking? Authorizing Provider  acetaminophen (TYLENOL) 325 MG tablet Take 650 mg by mouth every 6 (six) hours as needed for pain.   Yes Historical Provider, MD  beta carotene w/minerals (OCUVITE) tablet Take 1 tablet by mouth daily.   Yes Historical Provider, MD  bisacodyl (DULCOLAX) 10 MG suppository Place 10 mg rectally every 3 (three) days.   Yes Historical Provider, MD  calcium carbonate (TUMS - DOSED IN MG ELEMENTAL CALCIUM) 500 MG chewable tablet Chew 1 tablet by mouth daily.   Yes Historical Provider, MD  carbidopa-levodopa (SINEMET IR) 25-250 MG per tablet 1 tablet at 9 AM and at 5 pm, one half tablet at 1 PM, and 9 PM, brand-name medicine is medically necessary 01/02/15  Yes [name], MD  Cranberry 250 MG CAPS Take 250 mg by mouth daily.   Yes Historical Provider, MD  divalproex (DEPAKOTE SPRINKLE) 125 MG capsule 2 tablets in the morning, 4 tablets in the evening 07/25/13  Yes [name], MD  divalproex (DEPAKOTE) 500 MG DR tablet Take 500 mg by mouth at bedtime as needed.   Yes Historical Provider, MD  docusate sodium (COLACE) 100 MG capsule Take 100 mg by mouth 2 (two) times daily.   Yes Historical Provider, MD  ergocalciferol (VITAMIN  D2) 50000 UNITS capsule Take 50,000 Units by mouth every 30 (thirty) days.   Yes Historical Provider, MD  famotidine (PEPCID) 20 MG tablet Take 20 mg by mouth 2 (two) times daily.   Yes Historical Provider, MD  lidocaine (XYLOCAINE) 1 % (with preservative) injection 1 mL by Other route once. 01/15/14  Yes Historical Provider, MD  LORazepam (ATIVAN) 0.5 MG tablet Take 0.5 mg by mouth as needed. 01/27/14  Yes Historical Provider, MD  Melatonin 1 MG TABS Take 0.5 tablets by mouth at bedtime.   Yes Historical Provider, MD  naphazoline-pheniramine (NAPHCON-A) 0.025-0.3 % ophthalmic solution 1 drop 4 (four) times daily as needed for irritation.   Yes Historical Provider, MD  nitroGLYCERIN (NITROSTAT) 0.4 MG SL tablet  Place 0.4 mg under the tongue every 5 (five) minutes as needed for chest pain.   Yes Historical Provider, MD  polyethylene glycol (MIRALAX / GLYCOLAX) packet Take 17 g by mouth daily.   Yes Historical Provider, MD  sulfamethoxazole-trimethoprim (BACTRIM,SEPTRA) 400-80 MG per tablet Take 1 tablet by mouth daily.  07/24/13  Yes Historical Provider, MD  tiZANidine (ZANAFLEX) 2 MG tablet Take 2 mg by mouth every 8 (eight) hours as needed. 08/26/15  Yes Historical Provider, MD    ROS:  Out of a complete 14 system review of symptoms, the patient complains only of the following symptoms, and all other reviewed systems are negative.  Fatigue Hearing loss, drooling Eye itching, eye redness, blurred vision Frequent waking, daytime sleepiness Incontinence of the bladder Achy muscles, muscle cramps Memory loss Anxiety  Blood pressure 126/67, pulse 84, height 5\' 11"  (1.803 m), weight 144 lb (65.318 kg).  Physical Exam  General: The patient is alert and cooperative at the time of the examination.  Skin: No significant peripheral edema is noted.   Neurologic Exam  Mental status: The patient is alert and oriented x 3 at the time of the examination. The Mini-Mental Status examination done today shows a total score of 16/30. The patient is able to name 5 animals in 30 seconds.   Cranial nerves: Facial symmetry is present. Speech is dysphonic. Extraocular movements are full, with the exception that there is some restriction of superior gaze. Visual fields are full.  Motor: The patient has good strength in all 4 extremities.  Sensory examination: Soft touch sensation is symmetric on the face, arms, and legs.  Coordination: The patient has good finger-nose-finger and heel-to-shin bilaterally. The patient has apraxia with use of the extremities.  Gait and station: The patient requires assistance for standing. Once up, he has a tendency to lean backwards. He can ambulate with assistance taking short  shuffling steps. He is unable to stand by himself.  Reflexes: Deep tendon reflexes are symmetric.   Assessment/Plan:  One. Parkinson's disease  2. Gait disorder  3. Memory disorder  The patient continues to have significant issues with balance. He has a tendency to lean backwards, he cannot ambulate independently. He will continue his current dose of the Sinemet, and continue some level of exercise. He will followup in about 5 months. He denies any issues with swallowing.  Marlan Palau. Keith Willis MD 11/03/2015 6:40 PM  [facility] [address] [place], [place] [id number]  Phone [phone] Fax [phone]

## 2015-11-06 ENCOUNTER — Ambulatory Visit: Payer: Medicare Other | Admitting: Neurology

## 2016-03-11 ENCOUNTER — Encounter: Payer: Self-pay | Admitting: Neurology

## 2016-03-11 ENCOUNTER — Ambulatory Visit (INDEPENDENT_AMBULATORY_CARE_PROVIDER_SITE_OTHER): Payer: Medicare Other | Admitting: Neurology

## 2016-03-11 VITALS — BP 93/62 | HR 76 | Ht 71.0 in | Wt 145.0 lb

## 2016-03-11 DIAGNOSIS — G20 Parkinson's disease: Secondary | ICD-10-CM

## 2016-03-11 DIAGNOSIS — R269 Unspecified abnormalities of gait and mobility: Secondary | ICD-10-CM | POA: Diagnosis not present

## 2016-03-11 DIAGNOSIS — R413 Other amnesia: Secondary | ICD-10-CM

## 2016-03-11 MED ORDER — CARBIDOPA-LEVODOPA 25-250 MG PO TABS: ORAL_TABLET | ORAL | Status: DC

## 2016-03-11 NOTE — Progress Notes (Signed)
Reason for visit: Parkinson's disease  Darin Hickman is an 81 y.o. male  History of present illness:  Darin Hickman is a 81 year old gentleman, right-handed, with a history of Parkinson's disease, and a significant gait disorder with a memory disorder. The patient is residing in an extended care facility, [facility]. The patient has recently had some issues with discomfort involving the left forearm that generally occurs around dinnertime. The son accompanies the patient today, he indicates that there is a dystonic posturing event with the arm, the hand and fingers will flex. The patient is being given tizanidine when this occurs, which seems to be helpful. The patient is not sleeping well at night, but he does nap during the day. The patient continues to have an ongoing short-term memory disorder, his long-term memory is good. The patient is being ambulated several times a week with physical therapy. He requires a PT belt, with someone behind him as he tends to lean backwards. The patient walks with a walker. He has an indwelling Foley catheter, he is on oxybutynin for bladder spasms. He did have a recent bladder infection within the last week that presented with confusion. He returns to the office today for an evaluation.  Past Medical History  Diagnosis Date  . Full incontinence of feces   . Unspecified urinary incontinence   . Memory loss   . Abnormality of gait   . Paralysis agitans (HCC)   . Memory loss   . Memory deficit 07/25/2013  . Diplopia   . DVT (deep venous thrombosis) (HCC)     Left thigh  . Dyslipidemia   . REM sleep behavior disorder     Past Surgical History  Procedure Laterality Date  . Cholecystectomy      Family History  Problem Relation Age of Onset  . Cancer Mother   . Heart disease Father   . Diabetes Sister     Social history:  reports that he has never smoked. He has never used smokeless tobacco. He reports that he does not drink alcohol or use  illicit drugs.    Allergies  Allergen Reactions  . Benadryl [Diphenhydramine Hcl (Sleep)] Other (See Comments)    Hallucinations    Medications:  Prior to Admission medications   Medication Sig Start Date End Date Taking? Authorizing Provider  acetaminophen (TYLENOL) 325 MG tablet Take 650 mg by mouth every 6 (six) hours as needed for pain.   Yes Historical Provider, MD  beta carotene w/minerals (OCUVITE) tablet Take 1 tablet by mouth daily.   Yes Historical Provider, MD  bisacodyl (DULCOLAX) 10 MG suppository Place 10 mg rectally every 3 (three) days.   Yes Historical Provider, MD  calcium carbonate (TUMS - DOSED IN MG ELEMENTAL CALCIUM) 500 MG chewable tablet Chew 1 tablet by mouth daily.   Yes Historical Provider, MD  carbidopa-levodopa (SINEMET IR) 25-250 MG tablet 1 tablet at 9 AM, 1 PM, and at 5 pm, one half tablet at 9 PM, brand-name medicine is medically necessary 03/11/16  Yes York Spanielharles K Nyiah Pianka, MD  Cranberry 250 MG CAPS Take 250 mg by mouth daily.   Yes Historical Provider, MD  divalproex (DEPAKOTE SPRINKLE) 125 MG capsule 250 mg every morning. 2 tablets in the morning. 07/25/13  Yes York Spanielharles K Macala Baldonado, MD  divalproex (DEPAKOTE) 500 MG DR tablet Take 500 mg by mouth at bedtime as needed.   Yes Historical Provider, MD  docusate sodium (COLACE) 100 MG capsule Take 100 mg by mouth 2 (  two) times daily.   Yes Historical Provider, MD  ergocalciferol (VITAMIN D2) 50000 UNITS capsule Take 50,000 Units by mouth every 30 (thirty) days.   Yes Historical Provider, MD  famotidine (PEPCID) 20 MG tablet Take 20 mg by mouth 2 (two) times daily.   Yes Historical Provider, MD  lidocaine (XYLOCAINE) 1 % (with preservative) injection 1 mL by Other route once. 01/15/14  Yes Historical Provider, MD  LORazepam (ATIVAN) 0.5 MG tablet Take 0.5 mg by mouth as needed. 01/27/14  Yes Historical Provider, MD  Melatonin 1 MG TABS Take 0.5 tablets by mouth at bedtime.   Yes Historical Provider, MD    naphazoline-pheniramine (NAPHCON-A) 0.025-0.3 % ophthalmic solution 1 drop 4 (four) times daily as needed for irritation.   Yes Historical Provider, MD  nitroGLYCERIN (NITROSTAT) 0.4 MG SL tablet Place 0.4 mg under the tongue every 5 (five) minutes as needed for chest pain.   Yes Historical Provider, MD  oxybutynin (DITROPAN) 5 MG tablet Take 5 mg by mouth 2 (two) times daily as needed for bladder spasms.   Yes Historical Provider, MD  polyethylene glycol (MIRALAX / GLYCOLAX) packet Take 17 g by mouth daily.   Yes Historical Provider, MD  sulfamethoxazole-trimethoprim (BACTRIM,SEPTRA) 400-80 MG per tablet Take 1 tablet by mouth daily.  07/24/13  Yes Historical Provider, MD  tiZANidine (ZANAFLEX) 2 MG tablet Take 2 mg by mouth every 8 (eight) hours as needed. 08/26/15  Yes Historical Provider, MD    ROS:  Out of a complete 14 system review of symptoms, the patient complains only of the following symptoms, and all other reviewed systems are negative.  Hearing loss Drooling Eye itching, eye redness, blurred vision Daytime sleepiness Muscle cramps, walking difficulty, arm pain Memory loss, speech difficulty Confusion Anxiety  Blood pressure 93/62, pulse 76, height  (1.803 m), weight 145 lb (65.772 kg).  Physical Exam  General: The patient is alert and cooperative at the time of the examination.  Skin: No significant peripheral edema is noted.   Neurologic Exam  Mental status: The patient is alert and oriented x 2 at the time of the examination (not oriented to date). The Mini-Mental Status Examination done today shows a total score of 17/30. The patient is able to name 4 animals in 30 seconds.   Cranial nerves: Facial symmetry is present. Speech is dysphonic and dysarthric, difficult to understand. No aphasia is noted. Extraocular movements are full. Visual fields are full. Dyskinesias of the head and neck are noted.  Motor: The patient has good strength in all 4  extremities.  Sensory examination: Soft touch sensation is symmetric on the face, arms, and legs.  Coordination: The patient has good finger-nose-finger and heel-to-shin bilaterally. The patient does have some apraxia with the use of the extremities, however.  Gait and station: The patient requires assistance with standing, once up, he leans backwards, he is not able to stand independently, he cannot effectively walk.  Reflexes: Deep tendon reflexes are symmetric.   Assessment/Plan:  1. Parkinson's disease  2. Gait disorder  3. Memory disorder  The patient has been experiencing some discomfort and what sounds like a dystonic posturing problem with the left arm in the evenings. This may represent a "wearing off effect" from the Sinemet. We will go up on the Sinemet taking the 25/250 mg tablet taking 1 at 9 AM, 1 at 1 PM, 1 at 5 PM, one half tablet at 9 PM. The patient will follow-up in 5 months. The patient has difficulty  with hearing, he will be seeing an audiologist in the near future, he may require hearing aids. The patient is essentially nonambulatory.  Marlan Palau MD 03/11/2016 7:45 PM  [facility] [address] [place], [place] [address]  Phone [phone] Fax [phone]

## 2016-04-01 ENCOUNTER — Ambulatory Visit: Payer: Medicare Other | Admitting: Neurology

## 2016-08-19 ENCOUNTER — Ambulatory Visit (INDEPENDENT_AMBULATORY_CARE_PROVIDER_SITE_OTHER): Payer: Medicare Other | Admitting: Neurology

## 2016-08-19 ENCOUNTER — Encounter: Payer: Self-pay | Admitting: Neurology

## 2016-08-19 VITALS — BP 161/59 | HR 76 | Ht 71.0 in | Wt 148.0 lb

## 2016-08-19 DIAGNOSIS — G20 Parkinson's disease: Secondary | ICD-10-CM

## 2016-08-19 DIAGNOSIS — R413 Other amnesia: Secondary | ICD-10-CM | POA: Diagnosis not present

## 2016-08-19 DIAGNOSIS — R269 Unspecified abnormalities of gait and mobility: Secondary | ICD-10-CM

## 2016-08-19 DIAGNOSIS — M79602 Pain in left arm: Secondary | ICD-10-CM | POA: Diagnosis not present

## 2016-08-19 MED ORDER — CARBIDOPA-LEVODOPA 25-250 MG PO TABS: ORAL_TABLET | ORAL | Status: DC

## 2016-08-19 NOTE — Patient Instructions (Signed)
   Reduce the Sinemet 25/250 mg tablets taking one tablet at 9 AM, and 1/2 tablet at 1 PM, 5 PM and 9 PM.  Give tizanidine 2 mg tablet at a scheduled dose at 4 PM.

## 2016-08-19 NOTE — Progress Notes (Signed)
Reason for visit: Parkinson's disease  Darin Hickman is an 81 y.o. male  History of present illness:  Darin Hickman is a 81 year old right-handed white male with a history of Parkinson's disease with end-stage features, the patient is essentially not ambulatory. The patient lives in an extended care facility, he comes in today with his wife. He continues to have episodes of significant left arm pain that begins around 4 or 5 in the afternoon, and may last an hour and a half. The patient reports pain in the left wrist, hand, forearm and mid arm. He also reports some discomfort in the neck area, he indicates that he can turn his head to the left at times and get pain down the left arm. The initial indication was that he was having dystonic posturing of the left arm with the pain, but this is not always the case according to the wife. The patient is being ambulated 2 or 3 times a week, but he has to use a physical therapist with a PT belt to stand behind him, as he will lean backwards. The patient has not had any recent falls. He does have an underlying memory disorder. At times he does not sleep well at night.   Past Medical History:  Diagnosis Date  . Abnormality of gait   . Diplopia   . DVT (deep venous thrombosis) (HCC)    Left thigh  . Dyslipidemia   . Full incontinence of feces   . Memory deficit 07/25/2013  . Memory loss   . Memory loss   . Paralysis agitans (HCC)   . REM sleep behavior disorder   . Unspecified urinary incontinence     Past Surgical History:  Procedure Laterality Date  . CHOLECYSTECTOMY      Family History  Problem Relation Age of Onset  . Cancer Mother   . Heart disease Father   . Diabetes Sister     Social history:  reports that he has never smoked. He has never used smokeless tobacco. He reports that he does not drink alcohol or use drugs.    Allergies  Allergen Reactions  . Benadryl [Diphenhydramine Hcl (Sleep)] Other (See Comments)    Hallucinations     Medications:  Prior to Admission medications   Medication Sig Start Date End Date Taking? Authorizing Provider  acetaminophen (TYLENOL) 325 MG tablet Take 650 mg by mouth 2 (two) times daily.    Yes Historical Provider, MD  beta carotene w/minerals (OCUVITE) tablet Take 1 tablet by mouth daily.   Yes Historical Provider, MD  carbidopa-levodopa (SINEMET IR) 25-250 MG tablet 1 tablet at 9 AM, 1 PM, and at 5 pm, one half tablet at 9 PM, brand-name medicine is medically necessary 03/11/16  Yes York Spanielharles K Willis, MD  Cranberry 250 MG CAPS Take 250 mg by mouth daily.   Yes Historical Provider, MD  divalproex (DEPAKOTE) 250 MG DR tablet Take 250 mg by mouth 2 (two) times daily. 2 tablets in the morning. 07/25/13  Yes York Spanielharles K Willis, MD  docusate sodium (COLACE) 100 MG capsule Take 100 mg by mouth 2 (two) times daily.   Yes Historical Provider, MD  ergocalciferol (VITAMIN D2) 50000 UNITS capsule Take 50,000 Units by mouth every 30 (thirty) days.   Yes Historical Provider, MD  famotidine (PEPCID) 20 MG tablet Take 20 mg by mouth 2 (two) times daily.   Yes Historical Provider, MD  Melatonin 1 MG TABS Take 0.5 tablets by mouth at bedtime.  Yes Historical Provider, MD  naphazoline-pheniramine (NAPHCON-A) 0.025-0.3 % ophthalmic solution 1 drop 4 (four) times daily as needed for irritation.   Yes Historical Provider, MD  sulfamethoxazole-trimethoprim (BACTRIM,SEPTRA) 400-80 MG per tablet Take 1 tablet by mouth daily.  07/24/13  Yes Historical Provider, MD  tiZANidine (ZANAFLEX) 2 MG tablet Take 2 mg by mouth every 8 (eight) hours as needed. 08/26/15  Yes Historical Provider, MD  divalproex (DEPAKOTE) 500 MG DR tablet Take 500 mg by mouth at bedtime as needed.    Historical Provider, MD  LORazepam (ATIVAN) 0.5 MG tablet Take 0.5 mg by mouth as needed. 01/27/14   Historical Provider, MD  nitroGLYCERIN (NITROSTAT) 0.4 MG SL tablet Place 0.4 mg under the tongue every 5 (five) minutes as needed for chest pain.     Historical Provider, MD  oxybutynin (DITROPAN) 5 MG tablet Take 5 mg by mouth 2 (two) times daily as needed for bladder spasms.    Historical Provider, MD  polyethylene glycol (MIRALAX / GLYCOLAX) packet Take 17 g by mouth daily.    Historical Provider, MD    ROS:  Out of a complete 14 system review of symptoms, the patient complains only of the following symptoms, and all other reviewed systems are negative.  Eye itching, eye redness Walking difficulty Speech difficulty, memory problem Anxiety  Blood pressure (!) 161/59, pulse 76, height 5\' 11"  (1.803 m), weight 148 lb (67.1 kg).  Physical Exam  General: The patient is alert and cooperative at the time of the examination.  Skin: No significant peripheral edema is noted.   Neurologic Exam  Mental status: The patient is alert and oriented x 3 at the time of the examination.    Cranial nerves: Facial symmetry is present. Speech is dysphonic, not aphasic. Extraocular movements are full. Visual fields are full.  Motor: The patient has good strength in all 4 extremities.  Sensory examination: Soft touch sensation is symmetric on the face, arms, and legs.  Coordination: The patient has good finger-nose-finger, but he has some difficulty with shin bilaterally. The patient has apraxia with the use of the extremities.  Gait and station: The patient requires assistance with standing, once up, he leans backwards, he cannot stand independently.  Reflexes: Deep tendon reflexes are symmetric.   Assessment/Plan:  1. Parkinson's disease  2. Gait disorder, nonambulatory state  3. Left arm discomfort  4. Memory disorder  The patient apparently is having increased dyskinesias in the afternoon, we will reduce the Sinemet dosing taking the 25/250 milligrams tablets taking one full tablet at 9 AM, and one half tablets at 1 PM, 5 PM, and 9 PM. The patient will be given a scheduled dose of tizanidine for the left arm pain taking 2 mg at 4  p.m. The patient may require investigation of the cervical spine if the left arm pain does not abate. He will follow-up in 5 months.  Marlan Palau MD 08/19/2016 9:35 AM  [facility] [address] [place], [place] [address]  Phone [phone] Fax [phone]

## 2017-01-27 ENCOUNTER — Encounter: Payer: Self-pay | Admitting: Neurology

## 2017-01-27 ENCOUNTER — Ambulatory Visit (INDEPENDENT_AMBULATORY_CARE_PROVIDER_SITE_OTHER): Payer: Medicare Other | Admitting: Neurology

## 2017-01-27 VITALS — BP 162/65 | HR 67 | Ht 71.0 in

## 2017-01-27 DIAGNOSIS — G20 Parkinson's disease: Secondary | ICD-10-CM

## 2017-01-27 DIAGNOSIS — R413 Other amnesia: Secondary | ICD-10-CM

## 2017-01-27 DIAGNOSIS — R269 Unspecified abnormalities of gait and mobility: Secondary | ICD-10-CM

## 2017-01-27 NOTE — Progress Notes (Signed)
Reason for visit: Parkinson's disease  Darin Hickman is an 82 y.o. male  History of present illness:  Darin Hickman is a 82 year old right-handed white male with a history of Parkinson's disease. The patient has a significant gait disorder, he can only walk with assistance. The patient is having some problems with hearing, he has a hearing aid but reports that he gets a lot of peripheral noise that is amplified, and he cannot understand what people are saying to him. The patient is having a reduction in amplitude of his voice, he has difficulty communicating because of this. He has not had any falls since last seen. He denies any issues with swallowing. He does have a memory problem, his family indicates that he may have good days and bad days with this issue. The Sinemet was reduced on his last visit, he has developed some increased tremors following this, but the dyskinesias have improved.  Past Medical History:  Diagnosis Date  . Abnormality of gait   . Diplopia   . DVT (deep venous thrombosis) (HCC)    Left thigh  . Dyslipidemia   . Full incontinence of feces   . Memory deficit 07/25/2013  . Memory loss   . Memory loss   . Paralysis agitans (HCC)   . REM sleep behavior disorder   . Unspecified urinary incontinence     Past Surgical History:  Procedure Laterality Date  . CHOLECYSTECTOMY      Family History  Problem Relation Age of Onset  . Cancer Mother   . Heart disease Father   . Diabetes Sister     Social history:  reports that he has never smoked. He has never used smokeless tobacco. He reports that he does not drink alcohol or use drugs.    Allergies  Allergen Reactions  . Benadryl [Diphenhydramine Hcl (Sleep)] Other (See Comments)    Hallucinations    Medications:  Prior to Admission medications   Medication Sig Start Date End Date Taking? Authorizing Provider  carbidopa-levodopa (SINEMET IR) 25-250 MG tablet 1 tablet at 9 AM, and 1/2 tablet at 1 PM, and at 5  pm, 9 PM, brand-name medicine is medically necessary Patient taking differently: Take 1 tablet by mouth 3 (three) times daily. 1 tablet at 9 AM, and 1/2 tablet at 1 PM, and at 5 pm, 9 PM, brand-name medicine is medically necessary 08/19/16  Yes [name], MD  Cranberry 250 MG CAPS Take 250 mg by mouth daily.   Yes Historical Provider, MD  divalproex (DEPAKOTE) 250 MG DR tablet Take 250 mg by mouth 2 (two) times daily. 2 tablets in the morning. 07/25/13  Yes [name], MD  divalproex (DEPAKOTE) 500 MG DR tablet Take 500 mg by mouth at bedtime as needed.   Yes Historical Provider, MD  docusate sodium (COLACE) 100 MG capsule Take 100 mg by mouth 2 (two) times daily.   Yes Historical Provider, MD  ergocalciferol (VITAMIN D2) 50000 UNITS capsule Take 50,000 Units by mouth every 30 (thirty) days.   Yes Historical Provider, MD  famotidine (PEPCID) 20 MG tablet Take 20 mg by mouth 2 (two) times daily.   Yes Historical Provider, MD  Melatonin 1 MG TABS Take 0.5 tablets by mouth at bedtime.   Yes Historical Provider, MD  naphazoline-pheniramine (NAPHCON-A) 0.025-0.3 % ophthalmic solution 1 drop 4 (four) times daily as needed for irritation.   Yes Historical Provider, MD  oxybutynin (DITROPAN) 5 MG tablet Take 5 mg by mouth  2 (two) times daily as needed for bladder spasms.   Yes Historical Provider, MD  polyethylene glycol (MIRALAX / GLYCOLAX) packet Take 17 g by mouth daily.   Yes Historical Provider, MD  Polyvinyl Alcohol-Povidone (REFRESH OP) Apply 1 drop to eye as needed.   Yes Historical Provider, MD  sulfamethoxazole-trimethoprim (BACTRIM,SEPTRA) 400-80 MG per tablet Take 1 tablet by mouth daily.  07/24/13  Yes Historical Provider, MD  tiZANidine (ZANAFLEX) 2 MG tablet Take 2 mg by mouth every 8 (eight) hours as needed. Give scheduled dose at 4 PM 08/26/15  Yes Historical Provider, MD  LORazepam (ATIVAN) 0.5 MG tablet Take 0.5 mg by mouth as needed. 01/27/14   Historical Provider, MD    nitroGLYCERIN (NITROSTAT) 0.4 MG SL tablet Place 0.4 mg under the tongue every 5 (five) minutes as needed for chest pain.    Historical Provider, MD    ROS:  Out of a complete 14 system review of symptoms, the patient complains only of the following symptoms, and all other reviewed systems are negative.  Gait disorder Tremors  Blood pressure (!) 162/65, pulse 67, height 5\' 11"  (1.803 m), SpO2 98 %.  Physical Exam  General: The patient is alert and cooperative at the time of the examination.  Skin: No significant peripheral edema is noted.   Neurologic Exam  Mental status: The patient is alert and oriented x 3 at the time of the examination. The patient has apparent normal recent and remote memory, with an apparently normal attention span and concentration ability.   Cranial nerves: Facial symmetry is present. Speech is normal, no aphasia or dysarthria is noted. Extraocular movements are full. Visual fields are full. Decreased eye blink is noted. Masking of the face is seen.  Motor: The patient has good strength in all 4 extremities.  Sensory examination: Soft touch sensation is symmetric on the face, arms, and legs.  Coordination: The patient has good finger-nose-finger and heel-to-shin bilaterally. The patient has some apraxia with the use of the lower and upper extremities.  Gait and station: The patient is able to stand up with assistance. He has a tendency to lean backwards. He can take short shuffling steps with ambulation. The patient walks with the knees flexed.  Reflexes: Deep tendon reflexes are symmetric.   Assessment/Plan:  1. Parkinson's disease  2. Memory disorder  3. Gait disorder  The patient will remain on his current regimen of medications. He will be set up for physical therapy for voice amplification. The patient will follow-up in about 6 months.  Marlan Palau. Keith Aleysia Oltmann MD 01/27/2017 3:11 PM  [facility] [address] [place], [place] [address]  Phone [phone] Fax [phone]

## 2017-07-27 ENCOUNTER — Ambulatory Visit: Payer: Medicare Other | Admitting: Neurology

## 2017-08-11 ENCOUNTER — Ambulatory Visit (INDEPENDENT_AMBULATORY_CARE_PROVIDER_SITE_OTHER): Payer: Medicare Other | Admitting: Neurology

## 2017-08-11 ENCOUNTER — Encounter: Payer: Self-pay | Admitting: Neurology

## 2017-08-11 VITALS — BP 145/70 | HR 78 | Ht 71.0 in

## 2017-08-11 DIAGNOSIS — R413 Other amnesia: Secondary | ICD-10-CM

## 2017-08-11 DIAGNOSIS — R269 Unspecified abnormalities of gait and mobility: Secondary | ICD-10-CM | POA: Diagnosis not present

## 2017-08-11 DIAGNOSIS — G20 Parkinson's disease: Secondary | ICD-10-CM | POA: Diagnosis not present

## 2017-08-11 MED ORDER — CARBIDOPA-LEVODOPA 25-250 MG PO TABS: ORAL_TABLET | ORAL | 3 refills | Status: DC

## 2017-08-11 NOTE — Progress Notes (Signed)
Reason for visit: Parkinson's disease  Darin Hickman is an 82 y.o. male  History of present illness:  Darin Hickman is an 82 year old right-handed white male with a history of Parkinson's disease with a severe gait disturbance. The patient has not been independent with his walking for 5 or 6 years. He is able to walk up to 200 feet with a walker with a physical therapist with a PT felt. He has a tendency to lean backwards, he has not had any falls. The patient has undergone speech therapy that transiently helped his speech, but his hypophonia remains as an issue at the present. He has decreased hearing, and he has hearing aids. The patient is drooling on occasion, he is not having problems with swallowing or choking. The patient has decreased eye blink, he uses eyedrops help keep his eyes from drying out. He has some insomnia in the evening hours. He returns this office for an evaluation.  Past Medical History:  Diagnosis Date  . Abnormality of gait   . Diplopia   . DVT (deep venous thrombosis) (HCC)    Left thigh  . Dyslipidemia   . Full incontinence of feces   . Memory deficit 07/25/2013  . Memory loss   . Memory loss   . Paralysis agitans (HCC)   . REM sleep behavior disorder   . Unspecified urinary incontinence     Past Surgical History:  Procedure Laterality Date  . CHOLECYSTECTOMY      Family History  Problem Relation Age of Onset  . Cancer Mother   . Heart disease Father   . Diabetes Sister     Social history:  reports that he has never smoked. He has never used smokeless tobacco. He reports that he does not drink alcohol or use drugs.    Allergies  Allergen Reactions  . Benadryl [Diphenhydramine Hcl (Sleep)] Other (See Comments)    Hallucinations    Medications:  Prior to Admission medications   Medication Sig Start Date End Date Taking? Authorizing Provider  acetaminophen (TYLENOL) 325 MG tablet Take 650 mg by mouth every evening. Two tablets every morning and  2 tablets qhs   Yes [provider]  carbidopa-levodopa (SINEMET IR) 25-250 MG tablet 1 tablet at 9 AM, and 1/2 tablet at 1 PM, and at 5 pm, 9 PM, brand-name medicine is medically necessary Patient taking differently: Take 1 tablet by mouth 3 (three) times daily. 1 tablet in the morning, 1/2 tablet at 1pm, 5pm, and 9pm, brand-name medicine is medically necessary 08/19/16  Yes York SpanielWillis, Lailoni Baquera K, MD  Cranberry 250 MG CAPS Take 250 mg by mouth daily.   Yes [provider]  divalproex (DEPAKOTE SPRINKLE) 125 MG capsule Take 125 mg by mouth every evening.   Yes [provider]  docusate sodium (COLACE) 100 MG capsule Take 100 mg by mouth 2 (two) times daily.   Yes [provider]  ergocalciferol (VITAMIN D2) 50000 UNITS capsule Take 50,000 Units by mouth every 30 (thirty) days.   Yes [provider]  famotidine (PEPCID) 20 MG tablet Take 20 mg by mouth 2 (two) times daily.   Yes [provider]  Melatonin 1 MG TABS Take 0.5 tablets by mouth at bedtime.   Yes [provider]  Multiple Vitamins-Minerals (OCUVITE PO) Take 1 tablet by mouth daily.   Yes [provider]  polyethylene glycol (MIRALAX / GLYCOLAX) packet Take 17 g by mouth daily. 1/2 scoop daily in 6 ounces of fluid  Yes [provider]  polyvinyl alcohol (LIQUIFILM TEARS) 1.4 % ophthalmic solution Place 1 drop into both eyes 4 (four) times daily.   Yes [provider]  sulfamethoxazole-trimethoprim (BACTRIM,SEPTRA) 400-80 MG per tablet Take 1 tablet by mouth daily.  07/24/13  Yes [provider]  tiZANidine (ZANAFLEX) 2 MG tablet Take 2 mg by mouth every 8 (eight) hours as needed. Give scheduled dose at 4 PM 08/26/15  Yes [provider]  White Petrolatum-Mineral Oil (SYSTANE NIGHTTIME OP) Apply 1 application to eye at bedtime. 0.25 inch ribbon to conjunctival sac on left eye daily at bed   Yes [provider]    ROS:  Out of a  complete 14 system review of symptoms, the patient complains only of the following symptoms, and all other reviewed systems are negative.  Hearing loss, drooling Eye itching, eye redness, blurred vision Insomnia, frequent waking, daytime sleepiness Environmental allergies Incontinence of the bladder Walking difficulty Memory loss, speech difficulty Anxiety  Blood pressure (!) 145/70, pulse 78, height  (1.803 m), SpO2 98 %.  Physical Exam  General: The patient is alert and cooperative at the time of the examination.  Skin: No significant peripheral edema is noted.   Neurologic Exam  Mental status: The patient is alert and oriented x 3 at the time of the examination. The patient has apparent normal recent and remote memory, with an apparently normal attention span and concentration ability.   Cranial nerves: Facial symmetry is present. Speech is hypophonic, not aphasic. Extraocular movements are full. Visual fields are full. Masking of the face is seen  Motor: The patient has good strength in all 4 extremities.  Sensory examination: Soft touch sensation is symmetric on the face, arms, and legs.  Coordination: The patient has good finger-nose-finger and heel-to-shin bilaterally. The patient does have apraxia with the use of the extremities. The patient does have resting tremors on both arms.  Gait and station: The patient requires assistance with standing, once up, he has a tendency to lean backwards, he can take a few steps with assistance. He cannot stand independently.  Reflexes: Deep tendon reflexes are symmetric.   Assessment/Plan:  1. Parkinson's disease  2. Gait disturbance  3. Memory disturbance  The patient has a significant gait disorder, he is not able to ambulate independently. We will go up on the Sinemet taking a full tablet in the morning at 9 AM, and a full tablet 1 PM, one half tablet of the 25/250 mg Sinemet at 5 PM and 9 PM. The patient will  follow-up in 5 months.   Marlan Palau MD 08/11/2017 2:45 PM  [facility] [address] [place], [place] [address]  Phone [phone] Fax [phone]

## 2017-08-11 NOTE — Patient Instructions (Signed)
   We will increase the sinemet 25/250 to one full tablet at 9 am and 1 pm and 1/2 tablet at 5 pm and 9 pm

## 2018-01-11 ENCOUNTER — Ambulatory Visit: Payer: Medicare Other | Admitting: Neurology

## 2018-02-16 ENCOUNTER — Ambulatory Visit: Payer: Medicare Other | Admitting: Neurology

## 2018-08-22 ENCOUNTER — Ambulatory Visit (INDEPENDENT_AMBULATORY_CARE_PROVIDER_SITE_OTHER): Payer: Medicare Other | Admitting: Neurology

## 2018-08-22 ENCOUNTER — Encounter: Payer: Self-pay | Admitting: Neurology

## 2018-08-22 VITALS — BP 122/70 | HR 82 | Ht 71.0 in

## 2018-08-22 DIAGNOSIS — R413 Other amnesia: Secondary | ICD-10-CM

## 2018-08-22 DIAGNOSIS — R269 Unspecified abnormalities of gait and mobility: Secondary | ICD-10-CM | POA: Diagnosis not present

## 2018-08-22 DIAGNOSIS — G20 Parkinson's disease: Secondary | ICD-10-CM | POA: Diagnosis not present

## 2018-08-22 MED ORDER — CARBIDOPA-LEVODOPA 25-250 MG PO TABS: ORAL_TABLET | ORAL | 3 refills | Status: AC

## 2018-08-22 MED ORDER — ESCITALOPRAM OXALATE 5 MG PO TABS: 5.0000 mg | ORAL_TABLET | Freq: Every day | ORAL | 3 refills | Status: AC

## 2018-08-22 NOTE — Progress Notes (Signed)
Reason for visit: Parkinson's disease, dementia  Darin Hickman is an 83 y.o. male  History of present illness:  Darin Hickman is an 83 year old right-handed white male with a history of Parkinson's disease associated with a significant dementing illness.  Currently, his dementia is his primary disability.  The patient is quite immobile, he is essentially nonambulatory, he is getting some physical therapy and he may walk up to 120 feet with a walker and with someone adding on to him.  The patient has a tendency to lean backwards when he tries to stand up.  He has fallen on several occasions when he tries to get out of bed on his own or tries to get back into bed on his own.  The patient fractured his hip on the right on 21 January 2018.  The patient is still able to swallow fairly well, many days he does not eat well.  He may have some problems with hallucinations.  At times he does not recognize his family members.  He does have periods of agitation.  Currently he is on Sinemet 25/250 mg taking 1 in the morning, 1 in mid day, and one half with dinnertime and one half in the evening.  The patient is on tizanidine 2 mg daily.  He returns for an evaluation.  Past Medical History:  Diagnosis Date  . Abnormality of gait   . Diplopia   . DVT (deep venous thrombosis) (HCC)    Left thigh  . Dyslipidemia   . Full incontinence of feces   . Memory deficit 07/25/2013  . Memory loss   . Memory loss   . Paralysis agitans (HCC)   . REM sleep behavior disorder   . Unspecified urinary incontinence     Past Surgical History:  Procedure Laterality Date  . CHOLECYSTECTOMY      Family History  Problem Relation Age of Onset  . Cancer Mother   . Heart disease Father   . Diabetes Sister     Social history:  reports that he has never smoked. He has never used smokeless tobacco. He reports that he does not drink alcohol or use drugs.    Allergies  Allergen Reactions  . Benadryl [Diphenhydramine Hcl  (Sleep)] Other (See Comments)    Hallucinations    Medications:  Prior to Admission medications   Medication Sig Start Date End Date Taking? Authorizing Provider  acetaminophen (TYLENOL) 325 MG tablet Take 650 mg by mouth every evening. Two tablets every morning and 2 tablets qhs   Yes [provider]  carbidopa-levodopa (SINEMET IR) 25-250 MG tablet 1/2 tablet 4 times a day 08/22/18  Yes York SpanielWillis, Charles K, MD  Cranberry 250 MG CAPS Take 250 mg by mouth daily.   Yes [provider]  docusate sodium (COLACE) 100 MG capsule Take 100 mg by mouth 2 (two) times daily.   Yes [provider]  ENSURE (ENSURE) Take 237 mLs by mouth daily.   Yes [provider]  ergocalciferol (VITAMIN D2) 50000 UNITS capsule Take 50,000 Units by mouth every 30 (thirty) days.   Yes [provider]  famotidine (PEPCID) 20 MG tablet Take 20 mg by mouth 2 (two) times daily.   Yes [provider]  Melatonin 1 MG TABS Take 0.5 tablets by mouth at bedtime.   Yes [provider]  Multiple Vitamins-Minerals (OCUVITE PO) Take 1 tablet by mouth daily.   Yes [provider]  oxycodone (OXY-IR) 5 MG capsule Take 5 mg  by mouth every 8 (eight) hours as needed.   Yes [provider]  polyethylene glycol (MIRALAX / GLYCOLAX) packet Take 17 g by mouth daily. 1/2 scoop daily in 6 ounces of fluid   Yes [provider]  polyvinyl alcohol (LIQUIFILM TEARS) 1.4 % ophthalmic solution Place 1 drop into both eyes 4 (four) times daily.   Yes [provider]  Sodium Fluoride (CLINPRO 5000) 1.1 % PSTE Place 1 application onto teeth daily.   Yes [provider]  sulfamethoxazole-trimethoprim (BACTRIM,SEPTRA) 400-80 MG per tablet Take 1 tablet by mouth daily.  07/24/13  Yes [provider]  tiZANidine (ZANAFLEX) 2 MG tablet Take 2 mg by mouth every 8 (eight) hours as needed. Give scheduled dose at 4 PM 08/26/15  Yes [provider]  White Petrolatum-Mineral Oil (SYSTANE NIGHTTIME OP) Apply 1 application to eye at bedtime. 0.25 inch ribbon to conjunctival sac on left eye daily at bed   Yes [provider]  escitalopram (LEXAPRO) 5 MG tablet Take 1 tablet (5 mg total) by mouth daily. 08/22/18   [name], MD    ROS:  Out of a complete 14 system review of symptoms, the patient complains only of the following symptoms, and all other reviewed systems are negative.  Decreased activity, decreased appetite Eye redness, blurred vision Daytime sleepiness Walking difficulty Memory loss, speech difficulty, weakness Agitation, confusion, anxiety, hallucinations  Blood pressure 122/70, pulse 82, height 5\' 11"  (1.803 m), SpO2 97 %.  Physical Exam  General: The patient is alert and cooperative at the time of the examination.  Skin: No significant peripheral edema is noted.   Neurologic Exam  Mental status: The patient is alert and oriented x 1 at the time of the examination (oriented only to name).    Cranial nerves: Facial symmetry is present. Speech is hypophonic and dysarthric. Extraocular movements are full. Visual fields are full.  The patient will drool on occasion.  Motor: The patient has good strength in all 4 extremities.  Sensory examination: Soft touch sensation is symmetric on the face, arms, and legs.  Coordination: The patient has significant apraxia with trying to perform finger-nose-finger and heel shin bilaterally.  Resting tremors are noted with both upper extremities.  Gait and station: The patient requires assistance to stand, once up, the patient has a tendency to lean backwards, he cannot effectively walk.  Reflexes: Deep tendon reflexes are symmetric.   Assessment/Plan:  1.  Parkinson's disease  2.  Dementia  3.  Gait disorder  The patient is developing end-stage Parkinson's disease and dementia.  He is at risk for falling.  At this point we will cut back on Sinemet  taking 1/2 tablet 4 times daily.  The patient will be given low-dose Lexapro 5 mg daily for agitation.  He will follow-up through this office in about 5 months.  I would consider discontinuing the tizanidine, this may increase the risk of confusion and hallucinations.  Marlan Palau MD 08/22/2018 4:46 PM  [facility] [address] [place], [place] [address]  Phone [phone] Fax [phone]

## 2019-01-18 ENCOUNTER — Telehealth: Payer: Self-pay | Admitting: Neurology

## 2019-01-18 NOTE — Telephone Encounter (Signed)
Pt's wife said he is still at [facility] but is in Hospice program for the past 1-1/2 month. She said he met 90% of the qualifications for the program. She said nothing has changed with him at all. She feels he does not need to keep the appt 2/18 and pushed it out to 07/11/19 check in at [facility]

## 2019-01-23 ENCOUNTER — Ambulatory Visit: Payer: Medicare Other | Admitting: Neurology

## 2019-05-07 DEATH — deceased

## 2019-05-09 ENCOUNTER — Telehealth: Payer: Self-pay | Admitting: Neurology

## 2019-05-09 NOTE — Telephone Encounter (Signed)
Noted  

## 2019-05-09 NOTE — Telephone Encounter (Signed)
Pt's wife called to inform us that the pt has expired on May 20th.

## 2019-07-11 ENCOUNTER — Ambulatory Visit: Payer: Medicare Other | Admitting: Neurology

## 2024-06-22 ENCOUNTER — Encounter: Payer: Self-pay | Admitting: Advanced Practice Midwife
